# Patient Record
Sex: Male | Born: 1970
Health system: Southern US, Community
[De-identification: ages and names within clinical notes are randomized; demographics above are authoritative.]

## PROBLEM LIST (undated history)

## (undated) DIAGNOSIS — M659 Synovitis and tenosynovitis, unspecified: Secondary | ICD-10-CM

## (undated) DIAGNOSIS — S42309A Unspecified fracture of shaft of humerus, unspecified arm, initial encounter for closed fracture: Secondary | ICD-10-CM

## (undated) DIAGNOSIS — J45909 Unspecified asthma, uncomplicated: Secondary | ICD-10-CM

## (undated) DIAGNOSIS — T7840XA Allergy, unspecified, initial encounter: Secondary | ICD-10-CM

## (undated) HISTORY — DX: Unspecified asthma, uncomplicated: J45.909

## (undated) HISTORY — DX: Unspecified fracture of shaft of humerus, unspecified arm, initial encounter for closed fracture: S42.309A

## (undated) HISTORY — DX: Allergy, unspecified, initial encounter: T78.40XA

## (undated) HISTORY — PX: KNEE SURGERY: SHX244

## (undated) HISTORY — DX: Synovitis and tenosynovitis, unspecified: M65.9

---

## 2016-06-19 ENCOUNTER — Ambulatory Visit (INDEPENDENT_AMBULATORY_CARE_PROVIDER_SITE_OTHER): Payer: Managed Care, Other (non HMO) | Admitting: Family Medicine

## 2016-06-19 ENCOUNTER — Encounter: Payer: Self-pay | Admitting: Family Medicine

## 2016-06-19 VITALS — BP 121/82 | HR 72 | Temp 98.9°F | Resp 20 | Ht 71.0 in | Wt 210.2 lb

## 2016-06-19 DIAGNOSIS — M7522 Bicipital tendinitis, left shoulder: Secondary | ICD-10-CM | POA: Diagnosis not present

## 2016-06-19 DIAGNOSIS — Z7189 Other specified counseling: Secondary | ICD-10-CM

## 2016-06-19 DIAGNOSIS — Z7689 Persons encountering health services in other specified circumstances: Secondary | ICD-10-CM

## 2016-06-19 NOTE — Progress Notes (Signed)
Patient ID: Cody Bennett, male  DOB: [birth year], 45 y.o.   MRN: [id number] Patient Care Team    Relationship Specialty Notifications Start End  Natalia Leatherwood, DO PCP - General Family Medicine  06/19/16     Subjective:  Cody Bennett is a 45 y.o.  male present for new patient establishment. All past medical history, surgical history, allergies, family history, immunizations, medications and social history were ontained in the electronic medical record today. All recent labs, ED visits and hospitalizations within the last year were reviewed.   Pt was in [place] and tripped over a curb and fell to ground, around 4 months ago. Pt states since then he has had good and bad days with pain in the elbow and left shoulder. Shoulder pain is "uncomfortable" with over the head shoulder, but can be any movement. He does not notice a difference in strength or ROM. Pain is mostly located near bicep. Right medial elbow pain over distal bicep tendon.   Pt sometimes will take aleve for pain, but infrequently. He is not in pain today.  No prior neck surgery, injury or arthritis. He is working with a Chiropodist.   Health maintenance:  Colonoscopy: no fhx. Screen 50 Immunizations: tdap 2012, Influenza 2016 (encouraged yearly) Infectious disease screening: HIV 2012 PSA: No results found for: PSA   Immunization History  Administered Date(s) Administered  . Tdap 05/16/2011    Past Medical History:  Diagnosis Date  . Allergy   . Asthma    Allergies  Allergen Reactions  . Apple Swelling  . Peach [Prunus Persica] Swelling   Past Surgical History:  Procedure Laterality Date  . KNEE SURGERY Left 2010 and 2012   Family History  Problem Relation Age of Onset  . Hearing loss Mother   . Stroke Brother   . Arthritis Maternal Aunt   . Mental illness Maternal Aunt   . Arthritis Paternal Uncle   . Heart disease Paternal Uncle   . Asthma Maternal Grandmother   . Hearing loss Maternal  Grandmother   . Mental illness Maternal Grandfather    Social History   Social History  . Marital status: Married    Spouse name: N/A  . Number of children: N/A  . Years of education: N/A   Occupational History  . Not on file.   Social History Main Topics  . Smoking status: Never Smoker  . Smokeless tobacco: Never Used  . Alcohol use 3.6 oz/week    6 Glasses of wine per week  . Drug use: No  . Sexual activity: Yes    Partners: Female    Birth control/ protection: Condom     Comment: married   Other Topics Concern  . 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    Drinks caffeine.   Wears seatbelt, bicycle helmet   Exercises routinely.    Smoke detector in the home.    Feels safe in relationships.      Medication List       Accurate as of 06/19/16  1:47 PM. Always use your most recent med list.          cetirizine 10 MG tablet Commonly known as:  ZYRTEC Take 10 mg by mouth daily as needed for allergies.   loratadine 10 MG tablet Commonly known as:  CLARITIN Take 10 mg by mouth daily as needed for allergies.  No results found for this or any previous visit (from the past 2160 hour(s)).  Patient was never admitted.   ROS: 14 pt review of systems performed and negative (unless mentioned in an HPI)  Objective: BP 121/82 (BP Location: Right Arm, Patient Position: Sitting, Cuff Size: Large)   Pulse 72   Temp 98.9 F (37.2 C)   Resp 20   Ht 5\' 11"  (1.803 m)   Wt 210 lb 4 oz (95.4 kg)   SpO2 96%   BMI 29.32 kg/m  Gen: Afebrile. No acute distress. Nontoxic in appearance, well-developed, well-nourished,  Pleasant male.  HENT: AT. [place].MM[place], no oral lesions Eyes:Pupils Equal Round Reactive to light, Extraocular movements intact,  Conjunctiva without redness, discharge or icterus. Neck/lymp/endocrine: Supple,no lymphadenopathy CV: RRR, no edema, Chest: CTAB, no wheeze, rhonchi or  crackles. Abd: Soft. NTND. BS present.  Skin:  Warm and well-perfused. Skin intact. Neuro/Msk: Normal gait. PERLA. EOMi. Alert. Oriented x3.  NROM of bilateral arms, neg empty can test, left off, hawkins and O'briens. Mild discomfort with resisted flexion. Neg tinels at wrist and elbow. MS 5/5 bilateral UE. NV intact distally.  Psych: Normal affect, dress and demeanor. Normal speech. Normal thought content and judgment.  Assessment/plan: Cody Bennett is a 45 y.o. male present for est.  of care with acute complaint.  Biceps tendonitis on left - dicussed bicep tendonitis with pt. AVS on tendonitis and rehab/strengthenig.  (by HPI appears proximal and distal) - discussed arm band for lower arm with flares.  - naproxen BID PRN OTC with flares.  - pt to monitor for any signs of weakness, numbness or tingling.   - F/U PRN  - CPE schedule within 1-2 months. Can have fasting labs prior if desired CBC, CMP, TSH, lipid panel, a1c.    Return in about 1 month (around 07/19/2016) for CPE.   Greater than 30 minutes spent with patient, >50% of time spent face to face counseling patient and coordinating care.    Electronically signed by: Felix Pacinienee Kuneff, DO [facility]

## 2016-06-19 NOTE — Patient Instructions (Signed)
Biceps Tendon Tendinitis (Proximal) and Tenosynovitis With Rehab Tendonitis and tenosynovitis involve inflammation of the tendon and the tendon lining (sheath). The proximal biceps tendon is vulnerable to tendonitis and tenosynovitis, which causes pain and discomfort in the front of the shoulder and upper arm. The tendon lining secretes a fluid that helps lubricate the tendon, allowing for proper function without pain. When the tendon and its lining become inflamed, the tendon can no longer glide smoothly, causing pain. The proximal biceps tendon connects the biceps muscle to two bones of the shoulder. It is important for proper function of the elbow and turning the palm upward (supination) using the wrist. Proximal biceps tendon tendinitis may include a grade 1 or 2 strain of the tendon. Grade 1 strains involve a slight pull of the tendon without signs of tearing and no observed tendon lengthening. There is also no loss of strength. Grade 2 strains involve small tears in the tendon fibers. The tendon or muscle is stretched and strength is usually decreased.  SYMPTOMS   Pain, tenderness, swelling, warmth, or redness over the front of the shoulder.  Pain that gets worse with shoulder and elbow use, especially against resistance.  Limited motion of the shoulder or elbow.  Crackling sound (crepitation) when the tendon or shoulder is moved or touched. CAUSES  The symptoms of biceps tendonitis are due to inflammation of the tendon. Inflammation may be caused by:  Strain from sudden increase in amount or intensity of activity.  Direct blow or injury to the elbow (uncommon).  Overuse or repetitive elbow bending or wrist rotation, particularly when turning the palm up, or with elbow hyperextension. RISK INCREASES WITH:  Sports that involve contact or overhead arm activity (throwing sports, gymnastics, weightlifting, bodybuilding, rock climbing).  Heavy labor.  Poor strength and  flexibility.  Failure to warm up properly before activity. PREVENTION  Warm up and stretch properly before activity.  Allow time for recovery between activities.  Maintain physical fitness:  Strength, flexibility, and endurance.  Cardiovascular fitness.  Learn and use proper exercise technique. PROGNOSIS  With proper treatment, proximal biceps tendon tendonitis and tenosynovitis is usually curable within 6 weeks. Healing is usually quicker if the cause was a direct blow, not overuse.  RELATED COMPLICATIONS   Longer healing time if not properly treated or if not given enough time to heal.  Chronically inflamed tendon that causes persistent pain with activity, that may progress to constant pain and potentially rupture of the tendon.  Recurring symptoms, especially if activity is resumed too soon or with overuse, a direct blow, or use of poor exercise technique. TREATMENT Treatment first involves ice and medicine, to reduce pain and inflammation. It is helpful to modify activities that cause pain, to reduce the chances of causing the condition to get worse. Strengthening and stretching exercises should be performed to promote proper use of the muscles of the shoulder. These exercises may be performed at home or with a therapist. Other treatments may be given such as ultrasound or heat therapy. A corticosteroid injection may be recommended to help reduce inflammation of the tendon lining. Surgery is usually not necessary. Sometimes, if symptoms last for greater than 6 months, surgery will be advised to detach the tendon and re-insert it into the arm bone. Surgery to correct other shoulder problems that may be contributing to tendinitis may be advised before surgery for the tendinitis itself.  MEDICATION  If pain medicine is needed, nonsteroidal anti-inflammatory medicines (aspirin and ibuprofen), or other minor pain relievers (  acetaminophen), are often advised.  Do not take pain medicine  for 7 days before surgery.  Prescription pain relievers may be given if your caregiver thinks they are needed. Use only as directed and only as much as you need.  Corticosteroid injections may be given. These injections should only be used on the most severe cases, as one can only receive a limited number of them. HEAT AND COLD   Cold treatment (icing) should be applied for 10 to 15 minutes every 2 to 3 hours for inflammation and pain, and immediately after activity that aggravates your symptoms. Use ice packs or an ice massage.  Heat treatment may be used before performing stretching and strengthening activities prescribed by your caregiver, physical therapist, or athletic trainer. Use a heat pack or a warm water soak. SEEK MEDICAL CARE IF:   Symptoms get worse or do not improve in 2 weeks, despite treatment.  New, unexplained symptoms develop. (Drugs used in treatment may produce side effects.) EXERCISES RANGE OF MOTION (ROM) AND EXERCISES - Biceps Tendon (Proximal) and Tenosynovitis These exercises may help you when beginning to rehabilitate your injury. Your symptoms may go away with or without further involvement from your physician, physical therapist, or athletic trainer. While completing these exercises, remember:   Restoring tissue flexibility helps normal motion to return to the joints. This allows healthier, less painful movement and activity.  An effective stretch should be held for at least 30 seconds.  A stretch should never be painful. You should only feel a gentle lengthening or release in the stretched tissue. STRETCH - Flexion, Standing  Stand with good posture. With an underhand grip on your right / left hand and an overhand grip on the opposite hand, grasp a broomstick or cane so that your hands are a little more than shoulder width apart.  Keeping your right / left elbow straight and shoulder muscles relaxed, push the stick with your opposite hand to raise your right  / left arm in front of your body and then overhead. Raise your arm until you feel a stretch in your right / left shoulder, but before you have increased shoulder pain.  Try to avoid shrugging your right / left shoulder as your arm rises, by keeping your shoulder blade tucked down and toward your mid-back spine. Hold for __________ seconds.  Slowly return to the starting position. Repeat __________ times. Complete this exercise __________ times per day. STRETCH - Abduction, Supine  Lie on your back. With an underhand grip on your right / left hand and an overhand grip on the opposite hand, grasp a broomstick or cane so that your hands are a little more than shoulder width apart.  Keeping your right / left elbow straight and shoulder muscles relaxed, push the stick with your opposite hand to raise your right / left arm out to the side of your body and then overhead. Raise your arm until you feel a stretch in your right / left shoulder, but before you have increased shoulder pain.  Try to avoid shrugging your right / left shoulder as your arm rises, by keeping your shoulder blade tucked down and toward your mid-back spine. Hold for __________ seconds.  Slowly return to the starting position. Repeat __________ times. Complete this exercise __________ times per day. ROM - Flexion, Active-Assisted  Lie on your back. You may bend your knees for comfort.  Grasp a broomstick or cane so your hands are about shoulder width apart. Your right / left hand should   grip the end of the stick so that your hand is positioned "thumbs-up," as if you were about to shake hands.  Using your healthy arm to lead, raise your right / left arm overhead until you feel a gentle stretch in your shoulder. Hold for __________ seconds.  Use the stick to assist in returning your right / left arm to its starting position. Repeat __________ times. Complete this exercise __________ times per day.  STRETCH - Flexion, Standing    Stand facing a wall. Walk your right / left fingers up the wall until you feel a moderate stretch in your shoulder. As your hand gets higher, you may need to step closer to the wall or use a door frame to walk through.  Try to avoid shrugging your right / left shoulder as your arm rises, by keeping your shoulder blade tucked down and toward your mid-back spine.  Hold for __________ seconds. Use your other hand, if needed, to ease out of the stretch and return to the starting position. Repeat __________ times. Complete this exercise __________ times per day.  ROM - Internal Rotation   Using underhand grips, grasp a stick behind your back with both hands.  While standing upright with good posture, slide the stick up your back until you feel a mild stretch in the front of your shoulder.  Hold for __________ seconds. Slowly return to your starting position. Repeat __________ times. Complete this exercise __________ times per day.  STRETCH - Internal Rotation  Place your right / left hand behind your back, palm-up.  Throw a towel or belt over your opposite shoulder. Grasp the towel with your right / left hand.  While keeping an upright posture, gently pull up on the towel until you feel a stretch in the front of your right / left shoulder.  Avoid shrugging your right / left shoulder as your arm rises, by keeping your shoulder blade tucked down and toward your mid-back spine.  Hold for __________ seconds. Release the stretch by lowering your opposite hand. Repeat __________ times. Complete this exercise __________ times per day. STRENGTHENING EXERCISES - Biceps Tendon Tendinitis (Proximal) and Tenosynovitis These exercises may help you regain your strength after your physician has discontinued your restraint in a cast or brace. They may resolve your symptoms with or without further involvement from your physician, physical therapist or athletic trainer. While completing these exercises,  remember:   Muscles can gain both the endurance and the strength needed for everyday activities through controlled exercises.  Complete these exercises as instructed by your physician, physical therapist or athletic trainer. Increase the resistance and repetitions only as guided.  You may experience muscle soreness or fatigue, but the pain or discomfort you are trying to eliminate should never worsen during these exercises. If this pain does get worse, stop and make sure you are following the directions exactly. If the pain is still present after adjustments, discontinue the exercise until you can discuss the trouble with your caregiver. STRENGTH - Elbow Flexors, Isometric  Stand or sit upright on a firm surface. Place your right / left arm so that your hand is palm-up and at the height of your waist.  Place your opposite hand on top of your forearm. Gently push down as your right / left arm resists. Push as hard as you can with both arms, without causing any pain or movement at your right / left elbow. Hold this stationary position for __________ seconds.  Gradually release the tension in both   arms. Allow your muscles to relax completely before repeating. Repeat __________ times. Complete this exercise __________ times per day. STRENGTH - Shoulder Flexion, Isometric  With good posture and facing a wall, stand or sit about 4-6 inches away.  Keeping your right / left elbow straight, gently press the top of your fist into the wall. Increase the pressure gradually until you are pressing as hard as you can, without shrugging your shoulder or increasing any shoulder discomfort.  Hold for __________ seconds.  Release the tension slowly. Relax your shoulder muscles completely before you start the next repetition. Repeat __________ times. Complete this exercise __________ times per day.  STRENGTH - Elbow Flexors, Supinated  With good posture, stand or sit on a firm chair without armrests. Allow  your right / left arm to rest at your side with your palm facing forward.  Holding a __________ weight, or gripping a rubber exercise band or tubing,  bring your hand toward your shoulder.  Allow your muscles to control the resistance as your hand returns to your side. Repeat __________ times. Complete this exercise __________ times per day.  STRENGTH - Shoulder Flexion  Stand or sit with good posture. Grasp a __________ weight, or an exercise band or tubing, so that your hand is "thumbs-up," like when you shake hands.  Slowly lift your right / left arm as far as you can, without increasing any shoulder pain. At first, many people can only raise their hand to shoulder height.  Avoid shrugging your right / left shoulder as your arm rises, by keeping your shoulder blade tucked down and toward your mid-back spine.  Hold for __________ seconds. Control the descent of your hand as you slowly return to your starting position. Repeat __________ times. Complete this exercise __________ times per day.   This information is not intended to replace advice given to you by your health care provider. Make sure you discuss any questions you have with your health care provider.   Document Released: 10/01/2005 Document Revised: 10/22/2014 Document Reviewed: 01/13/2009 Elsevier Interactive Patient Education 2016 [employer]  Naproxen every 12 hours for a few days when pain increases.  Try arm band for lower arm/elbow.  If experience weakness, numbness or tingling would want to see you back.

## 2016-07-20 ENCOUNTER — Encounter: Payer: Self-pay | Admitting: Family Medicine

## 2016-07-20 ENCOUNTER — Ambulatory Visit (INDEPENDENT_AMBULATORY_CARE_PROVIDER_SITE_OTHER): Payer: Managed Care, Other (non HMO) | Admitting: Family Medicine

## 2016-07-20 VITALS — BP 118/83 | HR 72 | Temp 98.4°F | Resp 20 | Ht 71.0 in | Wt 209.5 lb

## 2016-07-20 DIAGNOSIS — Z1329 Encounter for screening for other suspected endocrine disorder: Secondary | ICD-10-CM

## 2016-07-20 DIAGNOSIS — Z130 Encounter for screening for diseases of the blood and blood-forming organs and certain disorders involving the immune mechanism: Secondary | ICD-10-CM

## 2016-07-20 DIAGNOSIS — Z6829 Body mass index (BMI) 29.0-29.9, adult: Secondary | ICD-10-CM | POA: Diagnosis not present

## 2016-07-20 DIAGNOSIS — Z0000 Encounter for general adult medical examination without abnormal findings: Secondary | ICD-10-CM | POA: Insufficient documentation

## 2016-07-20 DIAGNOSIS — Z23 Encounter for immunization: Secondary | ICD-10-CM | POA: Diagnosis not present

## 2016-07-20 DIAGNOSIS — Z131 Encounter for screening for diabetes mellitus: Secondary | ICD-10-CM | POA: Diagnosis not present

## 2016-07-20 DIAGNOSIS — Z13220 Encounter for screening for lipoid disorders: Secondary | ICD-10-CM | POA: Diagnosis not present

## 2016-07-20 DIAGNOSIS — M778 Other enthesopathies, not elsewhere classified: Secondary | ICD-10-CM

## 2016-07-20 LAB — COMPREHENSIVE METABOLIC PANEL
ALK PHOS: 64 U/L (ref 39–117)
ALT: 17 U/L (ref 0–53)
AST: 10 U/L (ref 0–37)
Albumin: 4.3 g/dL (ref 3.5–5.2)
BILIRUBIN TOTAL: 1.2 mg/dL (ref 0.2–1.2)
BUN: 16 mg/dL (ref 6–23)
CO2: 31 mEq/L (ref 19–32)
Calcium: 9.9 mg/dL (ref 8.4–10.5)
Chloride: 106 mEq/L (ref 96–112)
Creatinine, Ser: 1.04 mg/dL (ref 0.40–1.50)
GFR: 81.45 mL/min (ref >60.00)
GLUCOSE: 94 mg/dL (ref 70–99)
Potassium: 4.6 mEq/L (ref 3.5–5.1)
SODIUM: 142 meq/L (ref 135–145)
TOTAL PROTEIN: 6.8 g/dL (ref 6.0–8.3)

## 2016-07-20 LAB — LIPID PANEL
CHOL/HDL RATIO: 4
CHOLESTEROL: 198 mg/dL (ref 0–200)
HDL: 48.2 mg/dL (ref >39.00)
LDL CALC: 127 mg/dL — AB (ref 0–99)
NonHDL: 149.31
TRIGLYCERIDES: 114 mg/dL (ref 0.0–149.0)
VLDL: 22.8 mg/dL (ref 0.0–40.0)

## 2016-07-20 LAB — CBC WITH DIFFERENTIAL/PLATELET
BASOS ABS: 0 10*3/uL (ref 0.0–0.1)
Basophils Relative: 0.4 % (ref 0.0–3.0)
Eosinophils Absolute: 0.2 10*3/uL (ref 0.0–0.7)
Eosinophils Relative: 3.1 % (ref 0.0–5.0)
HCT: 45.7 % (ref 39.0–52.0)
Hemoglobin: 15.4 g/dL (ref 13.0–17.0)
LYMPHS ABS: 2 10*3/uL (ref 0.7–4.0)
LYMPHS PCT: 36 % (ref 12.0–46.0)
MCHC: 33.8 g/dL (ref 30.0–36.0)
MCV: 88.9 fl (ref 78.0–100.0)
MONOS PCT: 9.2 % (ref 3.0–12.0)
Monocytes Absolute: 0.5 10*3/uL (ref 0.1–1.0)
NEUTROS PCT: 51.3 % (ref 43.0–77.0)
Neutro Abs: 2.9 10*3/uL (ref 1.4–7.7)
Platelets: 246 10*3/uL (ref 150.0–400.0)
RBC: 5.13 Mil/uL (ref 4.22–5.81)
RDW: 13 % (ref 11.5–15.5)
WBC: 5.6 10*3/uL (ref 4.0–10.5)

## 2016-07-20 LAB — HEMOGLOBIN A1C: Hgb A1c MFr Bld: 5.3 % (ref 4.6–6.5)

## 2016-07-20 LAB — TSH: TSH: 1.01 u[IU]/mL (ref 0.35–4.50)

## 2016-07-20 NOTE — Progress Notes (Signed)
Patient ID: Cody Bennett, male  DOB: [birth year], 45 y.o.   MRN: [id number] Patient Care Team    Relationship Specialty Notifications Start End  Ma Hillock, DO PCP - General Family Medicine  06/19/16     Subjective:  Cody Bennett is a 45 y.o.  Male  present for CPE. All past medical history, surgical history, allergies, family history, immunizations, medications and social history were updated in the electronic medical record today. All recent labs, ED visits and hospitalizations within the last year were reviewed.  Pt has no complaints today. He is still have some mild intermittent discomfort at his elbow, discussed on prior appt. His shoulder is improved. He is using nsaids and arm band/brace which is helping. Briefly discussed capsicin cream OTC could also help during flares.   Health maintenance:  Colonoscopy: no fhx. Screen 50 Immunizations: tdap 2012, Influenza 2016 (encouraged yearly) Infectious disease screening: HIV 2012 DEXA: N/A Prostate cancer screen: low risk. Caucasian male, no changes in urinary stream. Screen around 49 Assistive device: none Oxygen use: none Patient has a Dental home. Hospitalizations/ED visits: none  Depression screen [facility] 2/9 07/20/2016 06/19/2016  Decreased Interest 0 0  Down, Depressed, Hopeless 0 0  PHQ - 2 Score 0 0   Fall Risk  07/20/2016 06/19/2016  Falls in the past year? Yes Yes  Number falls in past yr: 1 1  Injury with Fall? Yes Yes  Follow up Falls evaluation completed -   Current Exercise Habits: The patient does not participate in regular exercise at present Exercise limited by: orthopedic condition(s)  Immunization History  Administered Date(s) Administered  . Influenza,inj,Quad PF,36+ Mos 07/20/2016  . Tdap 05/16/2011     Past Medical History:  Diagnosis Date  . Allergy   . Asthma    Allergies  Allergen Reactions  . Apple Swelling  . Peach [Prunus Persica] Swelling   Past Surgical History:  Procedure  Laterality Date  . KNEE SURGERY Left 2010 and 2012   Family History  Problem Relation Age of Onset  . Hearing loss Mother   . Stroke Brother   . Arthritis Maternal Aunt   . Mental illness Maternal Aunt   . Arthritis Paternal Uncle   . Heart disease Paternal Uncle   . Asthma Maternal Grandmother   . Hearing loss Maternal Grandmother   . Mental illness Maternal Grandfather    Social History   Social History  . Marital status: Married    Spouse name: N/A  . Number of children: N/A  . Years of education: N/A   Occupational History  . Not on file.   Social History Main Topics  . Smoking status: Never Smoker  . Smokeless tobacco: Never Used  . Alcohol use 3.6 oz/week    6 Glasses of wine per week  . Drug use: No  . Sexual activity: Yes    Partners: Female    Birth control/ protection: Condom     Comment: married   Other Topics Concern  . 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    Drinks caffeine.   Wears seatbelt, bicycle helmet   Exercises routinely.    Smoke detector in the home.    Feels safe in relationships.      Medication List       Accurate as of 07/20/16  9:11 AM. Always use your most recent med list.  cetirizine 10 MG tablet Commonly known as:  ZYRTEC Take 10 mg by mouth daily as needed for allergies.        No results found for this or any previous visit (from the past 2160 hour(s)).  Patient was never admitted.   ROS: 14 pt review of systems performed and negative (unless mentioned in an HPI)  Objective: BP 118/83 (BP Location: Left Arm, Patient Position: Sitting, Cuff Size: Large)   Pulse 72   Temp 98.4 F (36.9 C)   Resp 20   Ht _0  (1.803 m)   Wt 209 lb 8 oz (95 kg)   SpO2 99%   BMI 29.22 kg/m  Gen: Afebrile. No acute distress. Nontoxic in appearance, well-developed, well-nourished,  Very pleasant, caucasian male.  HENT: AT. [place]. Bilateral TM visualized  and normal in appearance, normal external auditory canal. MMM, no oral lesions, adequate dentition. Bilateral nares within normal limits. Throat without erythema, ulcerations or exudates. no Cough on exam, no hoarseness on exam. Eyes:Pupils Equal Round Reactive to light, Extraocular movements intact,  Conjunctiva without redness, discharge or icterus. Neck/lymp/endocrine: Supple,no lymphadenopathy, no thyromegaly CV: RRR no murmur, trace edema, +2/4 P posterior tibialis pulses. no carotid bruits. No JVD. Chest: CTAB, no wheeze, rhonchi or crackles. Normal  Respiratory effort. good Air movement. Abd: Soft. falt. NTND. BS present. no Masses palpated. No hepatosplenomegaly. No rebound tenderness or guarding. Skin: no rashes, purpura or petechiae. Warm and well-perfused. Skin intact. Neuro/Msk: Normal gait. PERLA. EOMi. Alert. Oriented x3.  Cranial nerves II through XII intact. Muscle strength 5/5 upper/lower extremity. DTRs equal bilaterally. Psych: Normal affect, dress and demeanor. Normal speech. Normal thought content and judgment.   Assessment/plan: [name] is a 45 y.o. male present for CPE.  Encounter for preventive health examination Patient was encouraged to exercise greater than 150 minutes a week. Patient was encouraged to choose a diet filled with fresh fruits and vegetables, and lean meats. AVS provided to patient today for education/recommendation on gender specific health and safety maintenance. Influenza vaccine administered - Flu Vaccine QUAD 36+ mos PF IM (Fluarix & Fluzone Quad PF) BMI 29.0-29.9,adult - Comp Met (CMET) - HgB A1c - TSH - Lipid panel Screening for deficiency anemia - CBC w/Diff Thyroid disorder screen - TSH Screening cholesterol level - Lipid panel Diabetes mellitus screening - HgB A1c Tendonitis:  - Briefly discussed capsicin cream OTC could also help during flares. Continue elbow support/band, nsaids with flares. If continues to cause issues,  can send to PT/sports med.  - pt agreeable to plan and will follow up if needed.    Return for CPE. 1 year  Electronically signed by: Howard Pouch, DO [place]

## 2016-07-20 NOTE — Patient Instructions (Signed)

## 2016-07-23 ENCOUNTER — Telehealth: Payer: Self-pay | Admitting: Family Medicine

## 2016-07-23 NOTE — Telephone Encounter (Signed)
Please call pt: - his labs all look good.

## 2016-07-23 NOTE — Telephone Encounter (Signed)
Left message on patient voice mail with lab results 

## 2016-10-15 DIAGNOSIS — M65932 Unspecified synovitis and tenosynovitis, left forearm: Secondary | ICD-10-CM

## 2016-10-15 DIAGNOSIS — M659 Synovitis and tenosynovitis, unspecified: Secondary | ICD-10-CM

## 2016-10-15 HISTORY — DX: Synovitis and tenosynovitis, unspecified: M65.9

## 2016-10-15 HISTORY — DX: Unspecified synovitis and tenosynovitis, left forearm: M65.932

## 2016-10-30 ENCOUNTER — Ambulatory Visit (INDEPENDENT_AMBULATORY_CARE_PROVIDER_SITE_OTHER): Payer: Commercial Managed Care - PPO | Admitting: Family Medicine

## 2016-10-30 ENCOUNTER — Encounter: Payer: Self-pay | Admitting: Family Medicine

## 2016-10-30 ENCOUNTER — Telehealth: Payer: Self-pay | Admitting: Family Medicine

## 2016-10-30 ENCOUNTER — Ambulatory Visit (HOSPITAL_BASED_OUTPATIENT_CLINIC_OR_DEPARTMENT_OTHER)
Admission: RE | Admit: 2016-10-30 | Discharge: 2016-10-30 | Disposition: A | Discharge status: Home / Self Care | Payer: Commercial Managed Care - PPO | Source: Ambulatory Visit | Attending: Family Medicine | Admitting: Family Medicine

## 2016-10-30 VITALS — BP 123/85 | HR 81 | Temp 98.4°F | Resp 20 | Ht 71.0 in | Wt 214.5 lb

## 2016-10-30 DIAGNOSIS — R05 Cough: Secondary | ICD-10-CM | POA: Diagnosis not present

## 2016-10-30 DIAGNOSIS — R059 Cough, unspecified: Secondary | ICD-10-CM

## 2016-10-30 DIAGNOSIS — M778 Other enthesopathies, not elsewhere classified: Secondary | ICD-10-CM | POA: Diagnosis not present

## 2016-10-30 MED ORDER — MONTELUKAST SODIUM 10 MG PO TABS: 10.0000 mg | ORAL_TABLET | Freq: Every day | ORAL | 5 refills | Status: DC

## 2016-10-30 NOTE — Telephone Encounter (Signed)
Left message with xray results and instructions on patient voice mail per DPR.

## 2016-10-30 NOTE — Telephone Encounter (Signed)
Please call pt: - his cxr is normal. Treat with allergy regimen discussed.

## 2016-10-30 NOTE — Progress Notes (Signed)
Cody Bennett , 02/13/1971, 46 y.o., male MRN: [id number] Patient Care Team    Relationship Specialty Notifications Start End  Natalia Leatherwoodenee A Kuneff, DO PCP - General Family Medicine  06/19/16     CC: 2 complaints.  Subjective:   Cough: Pt states cough started in December. He reports it is a dry cough usually, but he is using mucinex DM. He endorses night sweats sometimes. He denies fever, chills, nausea, vomit, diarrhea or weight loss. He has had reflux in the past. He is taking the zyrtec intermittently. His wife noticed an increase in snoring. He endorses frontal sinus pressure and sneezing. He has just returned last night from a long trip in [place].   Left elbow pain: Pt has been using  nsaids and  arm band/brace and had done better. However, he states when he plays guitar or exercises it will flare back up. He points to the medial aspect of left elbow as location, nad denies radiation of pain, numbness or tingling.  Prior note: Pt was in [place] and tripped over a curb and fell to ground, around 4 months ago. Pt states since then he has had good and bad days with pain in the elbow and left shoulder. Shoulder pain is "uncomfortable" with over the head shoulder, but can be any movement. He does not notice a difference in strength or ROM. Pain is mostly located near bicep. Left medial elbow pain over distal bicep tendon.   Pt sometimes will take aleve for pain, but infrequently. He is not in pain today.  No prior neck surgery, injury or arthritis. He is working with a Chiropodistchiropracter.   Allergies  Allergen Reactions  . Apple Swelling  . Peach [Prunus Persica] Swelling   Social History  Substance Use Topics  . Smoking status: Never Smoker  . Smokeless tobacco: Never Used  . Alcohol use 3.6 oz/week    6 Glasses of wine per week   Past Medical History:  Diagnosis Date  . Allergy   . Asthma    Past Surgical History:  Procedure Laterality Date  . KNEE SURGERY Left 2010 and 2012   Family  History  Problem Relation Age of Onset  . Hearing loss Mother   . Stroke Brother   . Arthritis Maternal Aunt   . Mental illness Maternal Aunt   . Arthritis Paternal Uncle   . Heart disease Paternal Uncle   . Asthma Maternal Grandmother   . Hearing loss Maternal Grandmother   . Mental illness Maternal Grandfather    Allergies as of 10/30/2016      Reactions   Apple Swelling   Peach [prunus Persica] Swelling      Medication List       Accurate as of 10/30/16  8:59 AM. Always use your most recent med list.          cetirizine 10 MG tablet Commonly known as:  ZYRTEC Take 10 mg by mouth daily as needed for allergies.       No results found for this or any previous visit (from the past 24 hour(s)). No results found.   ROS: Negative, with the exception of above mentioned in HPI   Objective:  BP 123/85 (BP Location: Right Arm, Patient Position: Sitting, Cuff Size: Large)   Pulse 81   Temp 98.4 F (36.9 C)   Resp 20   Ht 5\' 11"  (1.803 m)   Wt 214 lb 8 oz (97.3 kg)   SpO2 98%   BMI 29.92 kg/m  Body mass index is 29.92 kg/m. Gen: Afebrile. No acute distress. Nontoxic in appearance, well developed, well nourished.  HENT: AT. [place]. Bilateral TM visualized WNL. MMM, no oral lesions. Bilateral nares with mild erythema, clear drainage. Throat without erythema or exudates. Cough  Present.  Eyes:Pupils Equal Round Reactive to light, Extraocular movements intact,  Conjunctiva without redness, discharge or icterus. Neck/lymp/endocrine: Supple,no lymphadenopathy CV: RRR  Chest: CTAB, no wheeze or crackles.  Abd: Soft. NTND. BS present. Neuro:  Normal gait. PERLA. EOMi. Alert. Oriented x3  MSK: no erythema or swelling left elbow. Focal ttp medial aspect just distal to olecranon process. Negative tinels. NV intact distally.  Assessment/Plan: Cody Bennett is a 46 y.o. male present for acute OV for  Tendonitis of elbow, left - worsening - Present for at least 6 months.  Failed NSAIDS, rest and arm band. Discussed SM and possible injection and pt would like to try.  - Ambulatory referral to Sports Medicine  Cough - New - Cough > 4-6 weeks.  - discussed allergies vs GERD vs infectious causes (given night sweats). - CXR ordered. - will treat as allergies, with zyrtec daily and start Singulair (prescribed) at night.  - if cxr normal, and no improvement on allergy regimen in 2-4 weeks would want to start omeprazole trial.  - F/U Dependent on clinical outcome and CXR results.   > 25 minutes spent with patient, >50% of time spent face to face    electronically signed by:  Felix Pacini, DO  [facility] - [place]

## 2016-10-30 NOTE — Patient Instructions (Addendum)
Start zyrtec daily and Singulair as been called in to your pharmacy to take nightly. Both work together to treat allergies. Continue the mucinex DM for cough for a few days until allergy regimen can take affect.   Please have chest xray.   If cough does not resolve in 4 weeks then I would want to see you and consider GERD/reflux regimen.    Please help [place] help you:  It is a privilege to be able to take care of great patients such as yourself. We are honored you have chosen [facility] for your Primary Care home. Below you will find basic instructions that you may need to access in the future. Please help [place] help you by reading the instructions, which cover many of the frequent questions we experience.   Prescription refills and request:  -In order to allow more efficient response time, please call your pharmacy for all refills. They will forward the request electronically to [place]. This allows for the quickest possible response. Request left on a nurse line can take longer to refill, since these are checked as time allows between office patients and other phone calls.  - refill request can take up to 3-5 working days to complete.  - If request is sent electronically and request is appropiate, it is usually completed in 1-2 business days.  - all patients will need to be seen routinely for all chronic medical conditions requiring prescription medications (see follow-up below). If you are overdue for follow up on your condition, you will be asked to make an appointment and we will call in enough medication to cover you until your appointment (up to 30 days).  - all controlled substances will require a face to face visit to request/refill.  - if you desire your prescriptions to go through a new pharmacy, and have an active script at original pharmacy, you will need to call your pharmacy and have scripts transferred to new pharmacy. This is completed between the pharmacy locations and not by your  provider.    Results: If any images or labs were ordered, it can take up to 1 week to get results depending on the test ordered and the lab/facility running and resulting the test. - Normal or stable results, which do not need further discussion, will be released to your mychart immediately with attached note to you. A call will not be generated for normal results. Please make certain to sign up for mychart. If you have questions on how to activate your mychart you can call the front office.  - If your results need further discussion, our office will attempt to contact you via phone, and if unable to reach you after 2 attempts, we will release your abnormal result to your mychart with instructions.  - All results will be automatically released in mychart after 1 week.  - Your provider will provide you with explanation and instruction on all relevant material in your results. Please keep in mind, results and labs may appear confusing or abnormal to the untrained eye, but it does not mean they are actually abnormal for you personally. If you have any questions about your results that are not covered, or you desire more detailed explanation than what was provided, you should make an appointment with your provider to do so.   Our office handles many outgoing and incoming calls daily. If we have not contacted you within 1 week about your results, please check your mychart to see if there is a  message first and if not, then contact our office.  In helping with this matter, you help decrease call volume, and therefore allow us to be able to respond to patients needs more efficiently.   Acute office visits (sick visit):  An acute visit is intended for a ONE new problem and are scheduled in shorter time slots to allow schedule openings for patients with new problems. This is the appropriate visit to discuss a new problem. In order to provide you with excellent quality medical care with proper time for you to  explain your problem, have an exam and receive treatment with instructions, these appointments should be limited to one new problem per visit. If you experience a new problem, in which you desire to be addressed, please make an acute office visit, we save openings on the schedule to accommodate you. Please do not save your new problem for any other type of visit, let us take care of it properly and quickly for you.   Follow up visits:  Depending on your condition(s) your provider will need to see you routinely in order to provide you with quality care and prescribe medication(s). Most chronic conditions (Example: hypertension, Diabetes, depression/anxiety... etc), require visits a couple times a year. Your provider will instruct you on proper follow up for your personal medical conditions and history. Please make certain to make follow up appointments for your condition as instructed. Failing to do so could result in lapse in your medication treatment/refills. If you request a refill, and are overdue to be seen on a condition, we will always provide you with a 30 day script (once) to allow you time to schedule.    Medicare wellness (well visit): - we have a wonderful Nurse Selena Batten(Kim), that will meet with you and provide you will yearly medicare wellness visits. These visits should occur yearly (can not be scheduled less than 1 calendar year apart) and cover preventive health, immunizations, advance directives and screenings you are entitled to yearly through your medicare benefits. Do not miss out on your entitled benefits, this is when medicare will pay for these benefits to be ordered for you.  These are strongly encouraged by your provider and is the appropriate type of visit to make certain you are up to date with all preventive health benefits. If you have not had your medicare wellness exam in the last 12 months, please make certain to schedule one by calling the office and schedule your medicare wellness  with Selena BattenKim as soon as possible.   Yearly physical (well visit):  - Adults are recommended to be seen yearly for physicals. Check with your insurance and date of your last physical, most insurances require one calendar year between physicals. Physicals include all preventive health topics, screenings, medical exam and labs that are appropriate for gender/age and history. You may have fasting labs needed at this visit. This is a well visit (not a sick visit), acute topics should not be covered during this visit.  - Pediatric patients are seen more frequently when they are younger. Your provider will advise you on well child visit timing that is appropriate for your their age. - This is not a medicare wellness visit. Medicare wellness exams do not have an exam portion to the visit. Some medicare companies allow for a physical, some do not allow a yearly physical. If your medicare allows a yearly physical you can schedule the medicare wellness with our nurse Selena BattenKim and have your physical with your provider after, on  the same day. Please check with insurance for your full benefits.   Late Policy/No Shows:  - all new patients should arrive 15-30 minutes earlier than appointment to allow [place] time  to  obtain all personal demographics,  insurance information and for you to complete office paperwork. - All established patients should arrive 10-15 minutes earlier than appointment time to update all information and be checked in .  - In our best efforts to run on time, if you are late for your appointment you will be asked to either reschedule or if able, we will work you back into the schedule. There will be a wait time to work you back in the schedule,  depending on availability.  - If you are unable to make it to your appointment as scheduled, please call 24 hours ahead of time to allow [place] to fill the time slot with someone else who needs to be seen. If you do not cancel your appointment ahead of time, you may be  charged a no show fee.

## 2016-10-31 ENCOUNTER — Ambulatory Visit: Payer: Self-pay | Admitting: Family Medicine

## 2016-11-20 ENCOUNTER — Encounter: Payer: Self-pay | Admitting: Family Medicine

## 2016-11-20 ENCOUNTER — Ambulatory Visit (INDEPENDENT_AMBULATORY_CARE_PROVIDER_SITE_OTHER): Payer: Commercial Managed Care - PPO | Admitting: Family Medicine

## 2016-11-20 VITALS — BP 137/75 | HR 81 | Wt 218.0 lb

## 2016-11-20 DIAGNOSIS — M778 Other enthesopathies, not elsewhere classified: Secondary | ICD-10-CM | POA: Diagnosis not present

## 2016-11-20 MED ORDER — NITROGLYCERIN 0.2 MG/HR TD PT24: MEDICATED_PATCH | TRANSDERMAL | 1 refills | Status: DC

## 2016-11-20 NOTE — Progress Notes (Signed)
   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 He has noticed left medial elbow pain now for months. He noted the pain occurring in the spring of 2017 after [place]. Since then he's had intermittent waxing and waning medial elbow pain with exercise as well as normal life activities. For example he notes pain when he has to carry a suitcase for travel. Additionally the pain is bothersome when he plays the guitar. He uses his left hand to pick. The pain is limiting his activity some. He's been seen by his primary care provider twice for this issue and failed to improve with a trial of oral NSAIDs which she has trouble tolerating as well as with a trial of home exercise program. He denies any radiating pain weakness or numbness. He denies any significant injury to this elbow to explain the onset of pain.  Past medical history, Surgical history, Family history not pertinant except as noted below, Social history, Allergies, and medications have been entered into the medical record, reviewed, and no changes needed.   Review of Systems: No headache, visual changes, nausea, vomiting, diarrhea, constipation, dizziness, abdominal pain, skin rash, fevers, chills, night sweats, weight loss, swollen lymph nodes, body aches, joint swelling, muscle aches, chest pain, shortness of breath, mood changes, visual or auditory hallucinations.   Objective:    Vitals:   11/20/16 0832  BP: 137/75  Pulse: 81   General: Well Developed, well nourished, and in no acute distress.  Neuro/Psych: Alert and oriented x3, extra-ocular muscles intact, able to move all 4 extremities, sensation grossly intact. Skin: Warm and dry, no rashes noted.  Respiratory: Not using accessory muscles, speaking in full sentences, trachea midline.  Cardiovascular: Pulses palpable, no  extremity edema. Abdomen: Does not appear distended. MSK: Left elbow and wrist are normal-appearing. The left elbow is tender to palpation at the medial epicondyle. Elbow motion is intact. Elbow stability is intact. Elbow and wrist strength are intact over patient does experience pain with resisted wrist flexion  Left medial elbow ultrasound. Normal bony structures. The insertion of the common flexor tendon insertion onto the medial epicondyle has areas of hyperechoic change consistent with chronic tendinopathy. There are no visible large tears nor is there any significant increased neovascularity. Impression: Medial epicondylitis.  No results found for this or any previous visit (from the past 24 hour(s)). No results found.  Impression and Recommendations:    Assessment and Plan: 46 y.o. male with Medial epicondylitis of the left elbow. Plan to continue trial of conservative management. Plan refer to formal hand/physical therapy as well as treat with home exercise program. We'll use nitroglycerin patch protocol and recheck in 4-6 weeks.   Discussed warning signs or symptoms. Please see discharge instructions. Patient expresses understanding.  CC: Felix Pacinienee Kuneff, DO

## 2016-11-20 NOTE — Patient Instructions (Signed)
Thank you for coming in today. Do the home exercises frequently.  Recheck in 4-6 weeks or sooner if needed.   Nitroglycerin Protocol   Apply 1/4 nitroglycerin patch to affected area daily.  Change position of patch within the affected area every 24 hours.  You may experience a headache during the first 1-2 weeks of using the patch, these should subside.  If you experience headaches after beginning nitroglycerin patch treatment, you may take your preferred over the counter pain reliever.  Another side effect of the nitroglycerin patch is skin irritation or rash related to patch adhesive.  Please notify our office if you develop more severe headaches or rash, and stop the patch.  Tendon healing with nitroglycerin patch may require 12 to 24 weeks depending on the extent of injury.  Men should not use if taking Viagra, Cialis, or Levitra.   Do not use if you have migraines or rosacea.    Golfer's Elbow Golfer's elbow, also called medial epicondylitis, is a condition that results from inflammation of the strong bands of tissue (tendons) that attach your forearm muscles to the inside of your bone at the elbow. These tendons affect the muscles that bend the palm toward the wrist (flexion). This condition is called golfer's elbow because it is more common among people who constantly bend and twist their wrists, such as golfers. This injury usually results from overuse. Tendons also become less flexible with age. This condition causes elbow pain that may spread to your forearm and upper arm. The pain may get worse when you bend your wrist downward. What are the causes? This condition is an overuse injury that is caused by:  Repeatedly flexing, turning, or twisting your wrist.  Constantly gripping objects with your hands. What increases the risk? This condition is more likely to develop in people who play golf or tennis or have jobs that require the constant use of their hands. This injury  is more common among:  Carpenters.  Gardeners.  Musicians.  Bricklayers.  Typists. What are the signs or symptoms? Symptoms of this condition include:  Pain near the inner elbow or forearm.  Reduced grip strength. How is this diagnosed? This condition is diagnosed based on your symptoms, medical history, and physical exam. During the exam, your health care provider may test your grip strength and move your wrist to check for pain. You may also have an MRI to confirm the diagnosis, look for other issues, and check for tears in the ligaments, muscles, or tendons. How is this treated? Treatment for this condition includes:  Stopping all activities that make you bend or twist your wrist until your pain and other symptoms go away.  Icing your wrist to relieve pain.  Taking NSAIDs or getting corticosteroid injections to reduce pain and swelling.  Doing stretches, range-of-motion, and strengthening exercises (physical therapy) as told by your health care provider. In rare cases, surgery may be needed if your condition does not improve. Follow these instructions at home:  If directed, apply ice to the injured area.  Put ice in a plastic bag.  Place a towel between your skin and the bag.  Leave the ice on for 20 minutes, 2-3 times a day.  Move your fingers often to avoid stiffness.  Raise (elevate) the injured area above the level of your heart while you are sitting or lying down.  Return to your normal activities as told by your health care provider. Ask your health care provider what activities are safe for  you.  Do exercises as told by your health care provider.  Do not use tobacco products, including cigarettes, chewing tobacco, or e-cigarettes. If you need help quitting, ask your health care provider.  Take over-the-counter and prescription medicines only as told by your health care provider.  Keep all follow-up visits as told by your health care provider. This is  important. How is this prevented?  Warm up and stretch before being active.  Cool down and stretch after being active.  Give your body time to rest between periods of activity.  Make sure to use equipment that fits you.  Be safe and responsible while being active to avoid falls.  Do at least 150 minutes of moderate-intensity exercise each week, such as brisk walking or water aerobics.  Maintain physical fitness, including:  Strength.  Flexibility.  Cardiovascular fitness.  Endurance.  Perform exercises to strengthen the forearm muscles.  Slow your golf swing to reduce shock in the arm when making contact with the ball, if you play golf. Contact a health care provider if:  Your pain does not improve or it gets worse.  You notice numbness in your hand. Get help right away if:  Your pain is severe.  You cannot move your wrist. This information is not intended to replace advice given to you by your health care provider. Make sure you discuss any questions you have with your health care provider. Document Released: 10/01/2005 Document Revised: 06/05/2016 Document Reviewed: 06/13/2015 Elsevier Interactive Patient Education  2017 [employer].

## 2016-11-23 ENCOUNTER — Ambulatory Visit: Payer: Commercial Managed Care - PPO | Admitting: Physical Therapy

## 2016-11-23 ENCOUNTER — Ambulatory Visit: Payer: Commercial Managed Care - PPO | Attending: Family Medicine | Admitting: Physical Therapy

## 2016-11-23 DIAGNOSIS — M25522 Pain in left elbow: Secondary | ICD-10-CM | POA: Insufficient documentation

## 2016-11-23 NOTE — Therapy (Signed)
[facility] Outpatient Rehabilitation [facility] [address]  [address] [place], [place], [address] Phone: [phone]   Fax:  [phone]  Physical Therapy Evaluation  Patient Details  Name: Cody Bennett MRN: [id number] Date of Birth: December 11, 1970 Referring Provider: Dr. Rodolph Bong, MD  Encounter Date: 11/23/2016      PT End of Session - 11/23/16 1030    Visit Number 1   Number of Visits 12   Date for PT Re-Evaluation 01/04/17   PT Start Time 0807   PT Stop Time 0843   PT Time Calculation (min) 36 min   Activity Tolerance Patient tolerated treatment well   Behavior During Therapy [facility] for tasks assessed/performed      Past Medical History:  Diagnosis Date  . Allergy   . Asthma     Past Surgical History:  Procedure Laterality Date  . KNEE SURGERY Left 2010 and 2012    There were no vitals filed for this visit.       Subjective Assessment - 11/23/16 0810    Subjective Pt reports that last spring, he started to develop L medial elbow pain. He had just bought a kayak and was trying out several different paddles that all had different angles on them. He believes this was about the same time that the medial elbow started. He does report that he fell on his L shoulder about a month before his elbow began hurting. He states it was sore for a little bit but went away on its own. He reports the pain has gone away and come back several times. The pain is mild but it is present and gets in the way of him doing the things he enjoys. He reports no N/T in the arm.    Diagnostic tests [place]: 11/20/16 "Normal bony structures. The insertion of the common flexor tendon insertion onto the medial epicondyle has areas of hyperechoic change consistent with chronic tendinopathy. There are no visible large tears nor is there any significant increased neovascularity."   Patient Stated Goals "back to zero pain all of the time"   Currently in Pain? Yes   Pain Score 3   Worst:  7-8/10 Best: 0/10 Avg: 2/10   Pain Location Elbow   Pain Orientation Medial;Left   Pain Descriptors / Indicators Aching;Pins and needles   Pain Type Chronic pain   Pain Radiating Towards starts near medial joint line and goes about halfway down   Pain Onset More than a month ago   Pain Frequency Intermittent   Aggravating Factors  Carrying luggage through airport; clearing snow from driveway; vibration coming through the arm   Pain Relieving Factors massaging; advil; ice   Effect of Pain on Daily Activities doesn't effect much but can no longer do some sports or activities that he enjoys due to the pain.             [facility] PT Assessment - 11/23/16 0817      Assessment   Medical Diagnosis Tendonitis of Elbow, Left   Referring Provider Dr. Rodolph Bong, MD   Onset Date/Surgical Date --  Last Spring- April/May   Hand Dominance Left   Next MD Visit TBD: 4-6 weeks   Prior Therapy None     Prior Function   Level of Independence Independent   Vocation Full time employment   [employer], Writing, traveling   Leisure [place], playing Guitar, reading, traveling     Observation/Other Assessments   Focus on Therapeutic Outcomes (  FOTO)  Elbow: 67% (33% limitation) Predicted 74% (26% limitation)     Posture/Postural Control   Posture/Postural Control No significant limitations     ROM / Strength   AROM / PROM / Strength AROM;Strength     AROM   Overall AROM Comments Elbow/Wrist AROM WFL and no pain   AROM Assessment Site Elbow;Wrist   Right/Left Elbow Right;Left   Right/Left Wrist Left     Strength   Overall Strength Comments pronation/supination 5/5; Grip Strength L 55# R 79# Some pain with grip on the L side   Strength Assessment Site Elbow;Wrist;Shoulder   Right/Left Elbow Right;Left   Right Elbow Flexion 5/5   Right Elbow Extension 5/5   Left Elbow Flexion 5/5  Painful   Left Elbow Extension 5/5   Right/Left Wrist Right;Left   Right Wrist Flexion 5/5    Right Wrist Extension 5/5   Left Wrist Flexion 4+/5  painful   Left Wrist Extension 5/5     Palpation   Palpation comment tender immediately below medial joint line/medial epicondyle and slightly anterior; some stretching felt when doing wrist flexor stretch                           PT Education - 11/23/16 1030    Education provided Yes   Education Details Eval findings, POC, & initial HEP   Person(s) Educated Patient   Methods Explanation;Demonstration;Handout   Comprehension Verbalized understanding;Returned demonstration          PT Short Term Goals - 11/23/16 1043      PT SHORT TERM GOAL #1   Title Pt will be independent with initial HEP by 12/07/16.   Status New           PT Long Term Goals - 11/23/16 1044      PT LONG TERM GOAL #1   Title pt will be independent with advanced HEP by 01/04/17   Status New     PT LONG TERM GOAL #2   Title Pt will report at least a 50% reduction in pain during normal daily activities and job tasks by 01/04/17.   Status New     PT LONG TERM GOAL #3   Title Pt will have improved L grip strength to within 5# of R grip strength to allow for improved function of the L UE by 01/04/17.   Status New     PT LONG TERM GOAL #4   Title Pt will report he is able to play his guitar for at least 30 minutes without an increase in L medial elbow pain by 01/04/17.   Status New               Plan - 11/23/16 1031    Clinical Impression Statement Cody Bennett is a 46 year old male who reports to therapy with complaints of L medial elbow pain that began around last April. He reports that it has gone away and come back several times. Pt demonstrates good sitting posture. He has B wrist, elbow, and shoulder AROM WFL. His strength is 5/5 bilaterally at the elbow and wrist except for L wrist flexion which is 4+/5 & painful. He also had pain during L elbow flexion strength testing. His grip strength was decreased on the L side at  55# with some pain noted. His R grip strength was 79#. He is left hand dominant. Upon palpation, patient had tenderness near the L medial joint line & medial  epicondyle and just slightly anterior to the medial joint line. Muscle tension was also noted in the L flexor bundle. He reported a stretch feeling with passive wrist extension indicating tight L wrist flexors. He will benefit from skilled physical therapy to decrease L elbow pain, improve L wrist flexor flexibility, and improve function of the L UE.    Rehab Potential Good   PT Frequency 2x / week   PT Duration 6 weeks   PT Treatment/Interventions Patient/family education;ADLs/Self Care Home Management;Cryotherapy;Electrical Stimulation;Moist Heat;Ultrasound;Therapeutic exercise;Manual techniques;Dry needling;Taping;Iontophoresis 4mg /ml Dexamethasone   PT Next Visit Plan begin grip and wrist & elbow flexor strengthening with a focus on eccentric strengthening; STM to flexor bundle as indicated; manual therapy & modalities PRN   Consulted and Agree with Plan of Care Patient      Patient will benefit from skilled therapeutic intervention in order to improve the following deficits and impairments:  Decreased activity tolerance, Decreased strength, Impaired flexibility, Impaired UE functional use, Pain  Visit Diagnosis: Pain in left elbow      G-Codes - 11/23/16 1048    Functional Assessment Tool Used Elbow: 67% (33% limitation) Predicted 74% (26% limitation)        Problem List Patient Active Problem List   Diagnosis Date Noted  . BMI 29.0-29.9,adult 07/20/2016  . Encounter for preventive health examination 07/20/2016  . Tendonitis of elbow, left 07/20/2016    Katheran Jamesaylor Heather Streeper, SPT 11/23/2016, 10:54 AM  [facility] [address]  [address] [place], [place], [address] Phone: [phone]   Fax:  [phone]  Name: Cody Bennett MRN: [id number] Date of Birth: 04/20/1971

## 2016-11-26 ENCOUNTER — Ambulatory Visit: Payer: Commercial Managed Care - PPO

## 2016-11-26 DIAGNOSIS — M25522 Pain in left elbow: Secondary | ICD-10-CM

## 2016-11-26 NOTE — Therapy (Signed)
[facility] Outpatient Rehabilitation [facility] [address]  [address] [place], [place], [address] Phone: [phone]   Fax:  [phone]  Physical Therapy Treatment  Patient Details  Name: Cody Bennett MRN: [id number] Date of Birth: 1971/08/12 Referring Provider: Dr. Rodolph Bong, MD  Encounter Date: 11/26/2016      PT End of Session - 11/26/16 0807    Visit Number 2   Number of Visits 12   Date for PT Re-Evaluation 01/04/17   PT Start Time 0803  pt. arrived late    PT Stop Time 0854  ice pack to end tx   PT Time Calculation (min) 51 min   Activity Tolerance Patient tolerated treatment well   Behavior During Therapy [facility] for tasks assessed/performed      Past Medical History:  Diagnosis Date  . Allergy   . Asthma     Past Surgical History:  Procedure Laterality Date  . KNEE SURGERY Left 2010 and 2012    There were no vitals filed for this visit.      Subjective Assessment - 11/26/16 0806    Subjective Pt. reporting he tried chair pushups this weekend and was able to perform without pain.     Patient Stated Goals "back to zero pain all of the time"   Currently in Pain? No/denies   Pain Score 0-No pain   Multiple Pain Sites No           [facility] Adult PT Treatment/Exercise - 11/26/16 0814      Elbow Exercises   Forearm Supination AROM;Seated;Left;10 reps  2 sets    Forearm Supination Limitations with golf club     Wrist Exercises   Wrist Radial Deviation AROM;20 reps;Seated;Left  2 sets    Bar Weights/Barbell (Radial Deviation) 3 lbs   Wrist Ulnar Deviation AROM;20 reps;Seated;Left  2 sets    Bar Weights/Barbell (Ulnar Deviation) 4 lbs   Other wrist exercises Seated L wrist flexion 3# 3" 3 x 20 reps   Other wrist exercises Seated L wrist flexor stretch 5 x 30"      Modalities   Modalities Cryotherapy     Cryotherapy   Number Minutes Cryotherapy 10 Minutes   Cryotherapy Location --  L elbow    Type of Cryotherapy Ice pack      Manual Therapy   Manual Therapy Soft tissue mobilization   Soft tissue mobilization cross-friction massage across proximal flexor tendon (medial elbow) x 3 min                 PT Education - 11/26/16 1304    Education Details Wrist flexion curls (with 2# dumbbell pt. stating has at home)    Person(s) Educated Patient   Methods Explanation;Demonstration;Verbal cues;Handout   Comprehension Verbalized understanding;Returned demonstration;Verbal cues required;Need further instruction          PT Short Term Goals - 11/26/16 6962      PT SHORT TERM GOAL #1   Title Pt will be independent with initial HEP by 12/07/16.   Status On-going           PT Long Term Goals - 11/26/16 9528      PT LONG TERM GOAL #1   Title pt will be independent with advanced HEP by 01/04/17   Status On-going     PT LONG TERM GOAL #2   Title Pt will report at least a 50% reduction in pain during normal daily activities and job tasks by 01/04/17.  Status On-going     PT LONG TERM GOAL #3   Title Pt will have improved L grip strength to within 5# of R grip strength to allow for improved function of the L UE by 01/04/17.   Status On-going     PT LONG TERM GOAL #4   Title Pt will report he is able to play his guitar for at least 30 minutes without an increase in L medial elbow pain by 01/04/17.   Status On-going               Plan - 11/26/16 16100808    Clinical Impression Statement Pt. tolerated initiation of wrist/elbow flexor strengthening well today with extension stretch performed throughout treatment today.  Manual cross-friction massage to proximal medial elbow performed today with pt. tolerating well.  Pt. reporting good relief from ice at home thus ice applied to medial elbow to end treatment today.  Will plan to progress pt. per tolerance in coming visits.   PT Treatment/Interventions Patient/family education;ADLs/Self Care Home Management;Cryotherapy;Electrical Stimulation;Moist  Heat;Ultrasound;Therapeutic exercise;Manual techniques;Dry needling;Taping;Iontophoresis 4mg /ml Dexamethasone   PT Next Visit Plan Continue grip and wrist & elbow flexor strengthening with a focus on eccentric strengthening; STM to flexor bundle as indicated; manual therapy & modalities PRN      Patient will benefit from skilled therapeutic intervention in order to improve the following deficits and impairments:  Decreased activity tolerance, Decreased strength, Impaired flexibility, Impaired UE functional use, Pain  Visit Diagnosis: Pain in left elbow     Problem List Patient Active Problem List   Diagnosis Date Noted  . BMI 29.0-29.9,adult 07/20/2016  . Encounter for preventive health examination 07/20/2016  . Tendonitis of elbow, left 07/20/2016    Kermit BaloMicah Kylar Leonhardt, PTA 11/26/16 1:09 PM  [facility] [address]  [address] [place], [place], [address] Phone: [phone]   Fax:  [phone]  Name: Cody Bennett MRN: [id number] Date of Birth: 10/28/1970

## 2016-11-27 ENCOUNTER — Ambulatory Visit: Payer: Commercial Managed Care - PPO | Admitting: Physical Therapy

## 2016-11-30 ENCOUNTER — Ambulatory Visit: Payer: Commercial Managed Care - PPO | Admitting: Physical Therapy

## 2016-11-30 DIAGNOSIS — M25522 Pain in left elbow: Secondary | ICD-10-CM | POA: Diagnosis not present

## 2016-11-30 NOTE — Therapy (Signed)
[facility] [address]  [address] [place], [place], [address] Phone: [phone]   Fax:  [phone]  Physical Therapy Treatment  Patient Details  Name: Cody Bennett MRN: [id number] Date of Birth: 08/24/1971 Referring Provider: Dr. Rodolph BongEvan S. Corey, MD  Encounter Date: 11/30/2016      PT End of Session - 11/30/16 0954    Visit Number 3   Number of Visits 12   Date for PT Re-Evaluation 01/04/17   PT Start Time 0802   PT Stop Time 0845   PT Time Calculation (min) 43 min   Activity Tolerance Patient tolerated treatment well   Behavior [facility] for tasks assessed/performed      Past Medical History:  Diagnosis Date  . Allergy   . Asthma     Past Surgical History:  Procedure Laterality Date  . KNEE SURGERY Left 2010 and 2012    There were no vitals filed for this visit.      Subjective Assessment - 11/30/16 0804    Subjective Pt reports his elbow pain has been on & off. Pt was able to perform HEP at home.    Patient Stated Goals "back to zero pain all of the time"   Currently in Pain? Yes   Pain Score 3    Pain Location Elbow   Pain Orientation Left;Medial   Pain Descriptors / Indicators Aching  "strain"   Pain Type Chronic pain   Pain Radiating Towards stays near elbow joint   Pain Onset More than a month ago   Pain Frequency Intermittent   Aggravating Factors  Carrying luggage through airport; claring snow from driveway; vibration coming through the arm   Pain Relieving Factors massaging; ice; advil   Effect of Pain on Daily Activities doesn't effect much but can no longer do some sports or activities that he enjoys due to the pain.                          [facility] Adult PT Treatment/Exercise - 11/30/16 0001      Shoulder Exercises: Seated   Retraction Both;5 reps   Retraction Limitations 5" holds     Shoulder Exercises: ROM/Strengthening   UBE (Upper Arm Bike) Lvl 2.0 x 6'  (3 fwd/3 back)   Pushups 15 reps   Pushups Limitations Wall push up +; 5" holds     Wrist Exercises   Forearm Supination Left;10 reps;Seated   Forearm Supination Limitations 2 sets with golf club; holding near grip   Forearm Pronation Left;10 reps;Seated   Forearm Pronation Limitations  2 sets with golf club; holding near grip   Wrist Flexion Left;20 reps;Seated   Bar Weights/Barbell (Wrist Flexion) 4 lbs   Wrist Flexion Limitations 2 sets   Wrist Extension Left;20 reps;Seated   Bar Weights/Barbell (Wrist Extension) 4 lbs   Wrist Radial Deviation AROM;20 reps;Seated;Left   Bar Weights/Barbell (Radial Deviation) 4 lbs   Wrist Ulnar Deviation Left;20 reps;Seated;Theraband   Theraband Level (Ulnar Deviation) Level 3 (Green)   Other wrist exercises --   Other wrist exercises Seated L wrist flexor stretch 2 x 30"      Cryotherapy   Number Minutes Cryotherapy 5 Minutes   Cryotherapy Location --  L medial elbow   Type of Cryotherapy Ice massage     Manual Therapy   Soft tissue mobilization cross-friction massage across proximal flexor tendon (medial elbow)  PT Short Term Goals - 11/26/16 0807      PT SHORT TERM GOAL #1   Title Pt will be independent with initial HEP by 12/07/16.   Status On-going           PT Long Term Goals - 11/26/16 1610      PT LONG TERM GOAL #1   Title pt will be independent with advanced HEP by 01/04/17   Status On-going     PT LONG TERM GOAL #2   Title Pt will report at least a 50% reduction in pain during normal daily activities and job tasks by 01/04/17.   Status On-going     PT LONG TERM GOAL #3   Title Pt will have improved L grip strength to within 5# of R grip strength to allow for improved function of the L UE by 01/04/17.   Status On-going     PT LONG TERM GOAL #4   Title Pt will report he is able to play his guitar for at least 30 minutes without an increase in L medial elbow pain by 01/04/17.   Status On-going                Plan - 11/30/16 9604    Clinical Impression Statement Pt is reporting that his pain is becoming more localized to right around the medial epicondyle. He reports he was just a little sore following last session. Today's session was focused on progressing wrist strengthening & introducing scapular stabilization exercises. Pt was able to tolerate today's session well with no increase in pain but with reported slight muscle fatigue following exercises. Pt was given STM & ice massage slightly distal to medial epicondyle. Pt will continue to benefit from skilled therapy to reduce elbow pain, improve wrist flexor strength, & improve function of L UE.    Rehab Potential Good   PT Treatment/Interventions Patient/family education;ADLs/Self Care Home Management;Cryotherapy;Electrical Stimulation;Moist Heat;Ultrasound;Therapeutic exercise;Manual techniques;Dry needling;Taping;Iontophoresis 4mg /ml Dexamethasone   PT Next Visit Plan Continue grip and wrist & elbow flexor strengthening with a focus on eccentric strengthening; STM to flexor bundle as indicated; manual therapy & modalities PRN   Consulted and Agree with Plan of Care Patient      Patient will benefit from skilled therapeutic intervention in order to improve the following deficits and impairments:  Decreased activity tolerance, Decreased strength, Impaired flexibility, Impaired UE functional use, Pain  Visit Diagnosis: Pain in left elbow     Problem List Patient Active Problem List   Diagnosis Date Noted  . BMI 29.0-29.9,adult 07/20/2016  . Encounter for preventive health examination 07/20/2016  . Tendonitis of elbow, left 07/20/2016    Katheran James, SPT 11/30/2016, 10:03 AM  [facility] [address]  [address] [place], [place], [address] Phone: [phone]   Fax:  [phone]  Name: Cody Bennett MRN: [id number] Date of Birth: 1971/07/11

## 2016-12-04 ENCOUNTER — Ambulatory Visit: Payer: Commercial Managed Care - PPO | Admitting: Physical Therapy

## 2016-12-04 DIAGNOSIS — M25522 Pain in left elbow: Secondary | ICD-10-CM | POA: Diagnosis not present

## 2016-12-04 NOTE — Therapy (Signed)
[facility] [place] [address]  [address] [place], [place], [address] Phone: [phone]   Fax:  [phone]  Physical Therapy Treatment  Patient Details  Name: Cody Bennett MRN: [id number] Date of Birth: 01/24/1971 Referring Provider: Dr. Rodolph BongEvan S. Corey, MD  Encounter Date: 12/04/2016      PT End of Session - 12/04/16 0957    Visit Number 4   Number of Visits 12   Date for PT Re-Evaluation 01/04/17   PT Start Time 0847   PT Stop Time 0930   PT Time Calculation (min) 43 min   Activity Tolerance Patient tolerated treatment well   Behavior During Therapy [facility] for tasks assessed/performed      Past Medical History:  Diagnosis Date  . Allergy   . Asthma     Past Surgical History:  Procedure Laterality Date  . KNEE SURGERY Left 2010 and 2012    There were no vitals filed for this visit.      Subjective Assessment - 12/04/16 0851    Subjective pt reports elbow has been pretty good but there was one moment this weekend where he had some pain. He worked out this weekend and had no problems during the workout but was a little sore following the workout. Pt is noticing pain stays right near the medial joint line on the L side.    Patient Stated Goals "back to zero pain all of the time"   Currently in Pain? Yes   Pain Score 2    Pain Location Elbow   Pain Orientation Left;Medial   Pain Descriptors / Indicators Aching   Pain Type Chronic pain   Pain Onset More than a month ago   Pain Frequency Intermittent   Aggravating Factors  Carrying luggage through airport, vibration coming through the arm; playing guitar > 30 minutes    Pain Relieving Factors massaging; ice; advil   Effect of Pain on Daily Activities doesn't effect much but can no longer do some sports or activities that he enjoys due to the pain.                         [facility] Adult PT Treatment/Exercise - 12/04/16 0001      Elbow Exercises   Forearm Supination Left;10 reps;Seated   Forearm Supination Limitations golf club with 1# wt on end; holding near grip   Forearm Pronation Left;10 reps;Seated   Forearm Pronation Limitations golf club with 1# wt on end; hold near grip   Wrist Flexion Left;20 reps;Seated   Bar Weights/Barbell (Wrist Flexion) 5 lbs   Wrist Flexion Limitations 2 sets   Wrist Extension Left;20 reps;Seated   Bar Weights/Barbell (Wrist Extension) 5 lbs     Shoulder Exercises: Prone   Other Prone Exercises L arm; Prone on table I, T, Y; 15 reps; 3" holds; 4# wt     Shoulder Exercises: ROM/Strengthening   UBE (Upper Arm Bike) lvl 2.5 x 6' (3' fwd/3' back)     Wrist Exercises   Wrist Radial Deviation AROM;20 reps;Seated;Left   Bar Weights/Barbell (Radial Deviation) 5 lbs   Wrist Ulnar Deviation Left;20 reps;Seated;Theraband   Theraband Level (Ulnar Deviation) Level 3 (Green)   Other wrist exercises Seated L wrist extension stretch; 2 reps x 30"    Other wrist exercises Seated L wrist flexor stretch 2 x 30"      Cryotherapy   Number Minutes Cryotherapy 5 Minutes   Cryotherapy Location --  L Medial Elbow   Type of Cryotherapy Ice massage     Manual Therapy   Soft tissue mobilization cross-friction massage across proximal flexor tendon (medial elbow)                 PT Education - 12/04/16 0955    Education provided Yes   Education Details updated HEP   Person(s) Educated Patient   Methods Explanation;Demonstration;Handout   Comprehension Verbalized understanding;Returned demonstration          PT Short Term Goals - 11/26/16 0807      PT SHORT TERM GOAL #1   Title Pt will be independent with initial HEP by 12/07/16.   Status On-going           PT Long Term Goals - 12/04/16 1002      PT LONG TERM GOAL #1   Title pt will be independent with advanced HEP by 01/04/17   Status On-going     PT LONG TERM GOAL #2   Title Pt will report at least a 50% reduction in pain during normal  daily activities and job tasks by 01/04/17.   Status On-going     PT LONG TERM GOAL #3   Title Pt will have improved L grip strength to within 5# of R grip strength to allow for improved function of the L UE by 01/04/17.   Status On-going     PT LONG TERM GOAL #4   Title Pt will report he is able to play his guitar for at least 1 hour without an increase in L medial elbow pain by 01/04/17.   Status Revised  Achieved 30 minutes on 12-04-16               Plan - 12/04/16 1000    Clinical Impression Statement Pt is reporting he is noticing the benefits from therapy. He began working out again this weekend and had no problems during the workout with some mild soreness following the workout. Today's session continued to focus on progressing wrist strength & scapular strengthening. Prone I, T, & Y's were introduced on the table today with the patient reporting muscle fatigue following the exercise. He was able to perform all exercises correctly and had no increase in pain. STM was performed to wrist flexors near L medial epicondyle with pt reporting his pain is becoming more localized to the area. Ice massage was continued to help with soreness while pt is at work today. He will continue to benefit from L UE strengthening & scapular stabilization exercises to improve overall function of L UE.    Rehab Potential Good   PT Treatment/Interventions Patient/family education;ADLs/Self Care Home Management;Cryotherapy;Electrical Stimulation;Moist Heat;Ultrasound;Therapeutic exercise;Manual techniques;Dry needling;Taping;Iontophoresis 4mg /ml Dexamethasone   PT Next Visit Plan Assess STG; Continue grip and wrist & elbow flexor strengthening with a focus on eccentric strengthening & scapular strengethening; STM to flexor bundle as indicated; manual therapy & modalities PRN   Consulted and Agree with Plan of Care Patient      Patient will benefit from skilled therapeutic intervention in order to improve the  following deficits and impairments:  Decreased activity tolerance, Decreased strength, Impaired flexibility, Impaired UE functional use, Pain  Visit Diagnosis: Pain in left elbow     Problem List Patient Active Problem List   Diagnosis Date Noted  . BMI 29.0-29.9,adult 07/20/2016  . Encounter for preventive health examination 07/20/2016  . Tendonitis of elbow, left 07/20/2016    Katheran James, SPT 12/04/2016, 10:08 AM  [facility] [address]  [address] [place], [place], [address] Phone: [phone]   Fax:  [phone]  Name: Cody Bennett MRN: [id number] Date of Birth: 07-03-1971

## 2016-12-05 ENCOUNTER — Ambulatory Visit: Payer: Commercial Managed Care - PPO

## 2016-12-05 DIAGNOSIS — M25522 Pain in left elbow: Secondary | ICD-10-CM

## 2016-12-05 NOTE — Therapy (Signed)
[facility] [address]  [address] [place], [place], [address] Phone: [phone]   Fax:  [phone]  Physical Therapy Treatment  Patient Details  Name: Cody Bennett MRN: [id number] Date of Birth: 10/11/1971 Referring Provider: Dr. Rodolph BongEvan S. Corey, MD  Encounter Date: 12/05/2016      PT End of Session - 12/05/16 0853    Visit Number 5   Number of Visits 12   Date for PT Re-Evaluation 01/04/17   PT Start Time 0848   PT Stop Time 0930   PT Time Calculation (min) 42 min   Activity Tolerance Patient tolerated treatment well   Behavior During Therapy [facility] for tasks assessed/performed      Past Medical History:  Diagnosis Date  . Allergy   . Asthma     Past Surgical History:  Procedure Laterality Date  . KNEE SURGERY Left 2010 and 2012    There were no vitals filed for this visit.      Subjective Assessment - 12/05/16 0849    Subjective Pt. reporting he has continued to work out at the gym over the weekend with only minor pain occasionally at L elbow.   Patient Stated Goals "back to zero pain all of the time"   Currently in Pain? Yes   Pain Score 1    Pain Location Elbow   Pain Orientation Left;Medial   Pain Descriptors / Indicators Aching   Pain Type Chronic pain   Pain Onset More than a month ago   Pain Frequency Intermittent   Multiple Pain Sites No           [facility] Adult PT Treatment/Exercise - 12/05/16 0858      Elbow Exercises   Forearm Supination Left;Seated;15 reps   Forearm Supination Limitations Golf Club with 1# on end; holding at end of grip    Forearm Pronation Left;Seated;15 reps   Forearm Pronation Limitations golf club with 1# wt. on end; holding at end near grip    Wrist Flexion Left;20 reps;Seated   Bar Weights/Barbell (Wrist Flexion) 5 lbs   Wrist Flexion Limitations 2 sets   Wrist Extension Left;20 reps;Seated   Bar Weights/Barbell (Wrist Extension) 5 lbs     Shoulder  Exercises: Standing   Horizontal ABduction AROM;15 reps  3" hold   Theraband Level (Shoulder Horizontal ABduction) Level 3 (Green)   External Rotation 15 reps;AROM;Left  3" hold; with retraction   Theraband Level (Shoulder External Rotation) Level 3 (Green)   Flexion AROM;15 reps   Theraband Level (Shoulder Flexion) Level 3 (Green)   Flexion Limitations Y with green TB in door     Wrist Exercises   Wrist Radial Deviation AROM;20 reps;Seated;Left  reps held the same due to treatment yesterday   Bar Weights/Barbell (Radial Deviation) 5 lbs   Wrist Ulnar Deviation Left;20 reps;Seated;Theraband  good technique with this    Theraband Level (Ulnar Deviation) Level 3 (Green)   Other wrist exercises Seated L wrist extension stretch; 3 reps x 30"    Other wrist exercises Seated L wrist flexor stretch 3 x 30"      Cryotherapy   Number Minutes Cryotherapy 5 Minutes   Cryotherapy Location --  L medial Elbow    Type of Cryotherapy Ice massage     Manual Therapy   Manual Therapy Soft tissue mobilization   Soft tissue mobilization cross-friction massage across proximal flexor tendon (medial elbow)  PT Short Term Goals - 12/05/16 0951      PT SHORT TERM GOAL #1   Title Pt will be independent with initial HEP by 12/07/16.   Status Achieved           PT Long Term Goals - 12/04/16 1002      PT LONG TERM GOAL #1   Title pt will be independent with advanced HEP by 01/04/17   Status On-going     PT LONG TERM GOAL #2   Title Pt will report at least a 50% reduction in pain during normal daily activities and job tasks by 01/04/17.   Status On-going     PT LONG TERM GOAL #3   Title Pt will have improved L grip strength to within 5# of R grip strength to allow for improved function of the L UE by 01/04/17.   Status On-going     PT LONG TERM GOAL #4   Title Pt will report he is able to play his guitar for at least 1 hour without an increase in L medial elbow pain by  01/04/17.   Status Revised  Achieved 30 minutes on 12-04-16               Plan - 12/05/16 0853    Clinical Impression Statement Pt. leaving for weeklong snow skiing trip and will return to therapy on 3.5.18.  Pt. reporting consistent adherence to updated HEP 2x/day and feels he is improving with therapy.  Pt. reporting no soreness following yesterday's treatment thus scapular I's, T's, and Y's performed again today in standing with band.  Wrist, elbow strengthening continued today without progression to avoid over fatigue from consecutive therapy days.  Manual massage to medial flexor bundle and ice massage continued and tolerated well.  Will plan to f/u with pt. upon return from ski trip and progress HEP prn.   PT Treatment/Interventions Patient/family education;ADLs/Self Care Home Management;Cryotherapy;Electrical Stimulation;Moist Heat;Ultrasound;Therapeutic exercise;Manual techniques;Dry needling;Taping;Iontophoresis 4mg /ml Dexamethasone   PT Next Visit Plan HEP update prn; continue grip and wrist & elbow flexor strengthening with a focus on eccentric strengthening & scapular strengethening; STM to flexor bundle as indicated; manual therapy & modalities PRN      Patient will benefit from skilled therapeutic intervention in order to improve the following deficits and impairments:  Decreased activity tolerance, Decreased strength, Impaired flexibility, Impaired UE functional use, Pain  Visit Diagnosis: Pain in left elbow     Problem List Patient Active Problem List   Diagnosis Date Noted  . BMI 29.0-29.9,adult 07/20/2016  . Encounter for preventive health examination 07/20/2016  . Tendonitis of elbow, left 07/20/2016    Kermit Balo, PTA 12/05/16 4:54 PM   [facility] [address]  [address] [place], [place], [address] Phone: [phone]   Fax:  [phone]  Name: Cody Bennett MRN: [id number] Date of Birth:  1970/11/17

## 2016-12-17 ENCOUNTER — Ambulatory Visit: Payer: Commercial Managed Care - PPO | Attending: Family Medicine

## 2016-12-17 DIAGNOSIS — M25522 Pain in left elbow: Secondary | ICD-10-CM

## 2016-12-17 NOTE — Therapy (Signed)
[facility] [address]  [address] [place], [place], [address] Phone: [phone]   Fax:  [phone]  Physical Therapy Treatment  Patient Details  Name: Cody Bennett MRN: [id number] Date of Birth: 1970-12-05 Referring Provider: Dr. Rodolph Bong, MD  Encounter Date: 12/17/2016      PT End of Session - 12/17/16 0812    Visit Number 6   Number of Visits 12   Date for PT Re-Evaluation 01/04/17   PT Start Time 0804  Pt. arriving late    PT Stop Time 0843   PT Time Calculation (min) 39 min   Activity Tolerance Patient tolerated treatment well   Behavior During Therapy [facility] for tasks assessed/performed      Past Medical History:  Diagnosis Date  . Allergy   . Asthma     Past Surgical History:  Procedure Laterality Date  . KNEE SURGERY Left 2010 and 2012    There were no vitals filed for this visit.      Subjective Assessment - 12/17/16 0805    Subjective Pt. reporting L elbow gave him some pain while skiing in [place] however, "felt good overall".  Pt. reporting pain staying at low level and lasting short duration.  Pt. reporting he was able to get to, "most of the exercises" over vacation.    Patient Stated Goals "back to zero pain all of the time"   Currently in Pain? No/denies   Pain Score 0-No pain   Multiple Pain Sites No           [facility] Adult PT Treatment/Exercise - 12/17/16 0808      Elbow Exercises   Forearm Supination Left;Seated;20 reps  2 sets    Forearm Supination Limitations Golf club with 1# on end; hold at end of grip    Forearm Pronation Left;Seated;20 reps  2 sets    Forearm Pronation Limitations golf club with 1# at end; holding on end of grip   Wrist Flexion Left;Seated;15 reps   Bar Weights/Barbell (Wrist Flexion) --  6 lb   Wrist Flexion Limitations 2 sets    Wrist Extension Left;Seated;15 reps   Bar Weights/Barbell (Wrist Extension) --  6lbs   Wrist Extension Limitations 2  sets      Shoulder Exercises: Prone   Other Prone Exercises Prone on green p-ball (65cm) I's, T's, W's, Y's with 3# 5" x 15 reps     Shoulder Exercises: Standing   Horizontal ABduction AROM;10 reps  5" hold    Theraband Level (Shoulder Horizontal ABduction) Level 4 (Blue)   External Rotation AROM;Left;10 reps   Theraband Level (Shoulder External Rotation) Level 4 (Blue)   Flexion AROM;10 reps   Theraband Level (Shoulder Flexion) Level 4 (Blue)   Flexion Limitations Y      Shoulder Exercises: ROM/Strengthening   UBE (Upper Arm Bike) lvl 3.5 x 6' (3' fwd/3' back)     Wrist Exercises   Wrist Radial Deviation AROM;Seated;Left;15 reps   Bar Weights/Barbell (Radial Deviation) --  6 lbs    Wrist Ulnar Deviation Left;Theraband;15 reps;Standing   Bar Weights/Barbell (Ulnar Deviation) --  6lbs    Other wrist exercises Seated L wrist extension stretch; 2 reps x 30"    Other wrist exercises Seated L wrist flexor stretch 2 x 30"             PT Short Term Goals - 12/05/16 0951      PT SHORT TERM GOAL #1   Title Pt  will be independent with initial HEP by 12/07/16.   Status Achieved           PT Long Term Goals - 12/04/16 1002      PT LONG TERM GOAL #1   Title pt will be independent with advanced HEP by 01/04/17   Status On-going     PT LONG TERM GOAL #2   Title Pt will report at least a 50% reduction in pain during normal daily activities and job tasks by 01/04/17.   Status On-going     PT LONG TERM GOAL #3   Title Pt will have improved L grip strength to within 5# of R grip strength to allow for improved function of the L UE by 01/04/17.   Status On-going     PT LONG TERM GOAL #4   Title Pt will report he is able to play his guitar for at least 1 hour without an increase in L medial elbow pain by 01/04/17.   Status Revised  Achieved 30 minutes on 12-04-16               Plan - 12/17/16 0829    Clinical Impression Statement Pt. reporting only occasional low-level  pain while using ski poles on vacation in [place].  Pt. able to perform, "most exercises" over last two weeks on vacation.  Pt. noting he still feels he is improving and was pleased with performance of L UE with skiing.  Today's treatment with advancement of most all activities with pt. tolerating well.  I's, T's, and Y's prone on p-ball introduced and tolerated well.  Pt. reporting he is still performing ice massage and cross-friction massage at home daily.  Pt. will continue to benefit from further skilled therapy to improve activity tolerance and decrease pain with daily activities and job related tasks.      PT Treatment/Interventions Patient/family education;ADLs/Self Care Home Management;Cryotherapy;Electrical Stimulation;Moist Heat;Ultrasound;Therapeutic exercise;Manual techniques;Dry needling;Taping;Iontophoresis 4mg /ml Dexamethasone   PT Next Visit Plan Review HEP update prn; continue grip and wrist & elbow flexor strengthening with a focus on eccentric strengthening & scapular strengethening; STM to flexor bundle as indicated; manual therapy & modalities PRN      Patient will benefit from skilled therapeutic intervention in order to improve the following deficits and impairments:  Decreased activity tolerance, Decreased strength, Impaired flexibility, Impaired UE functional use, Pain  Visit Diagnosis: Pain in left elbow     Problem List Patient Active Problem List   Diagnosis Date Noted  . BMI 29.0-29.9,adult 07/20/2016  . Encounter for preventive health examination 07/20/2016  . Tendonitis of elbow, left 07/20/2016    Kermit BaloMicah Kaito [place]enburg, PTA 12/17/16 11:43 AM   [facility] [place] [address]  [address] [place], [place], [address] Phone: [phone]   Fax:  [phone]  Name: Cody Bennett MRN: [id number] Date of Birth: 09/05/1971

## 2016-12-19 ENCOUNTER — Ambulatory Visit: Payer: Commercial Managed Care - PPO | Admitting: Physical Therapy

## 2016-12-19 DIAGNOSIS — M25522 Pain in left elbow: Secondary | ICD-10-CM | POA: Diagnosis not present

## 2016-12-19 NOTE — Therapy (Signed)
[facility] [address]  [address] [place], [place], [address] Phone: [phone]   Fax:  [phone]  Physical Therapy Treatment  Patient Details  Name: Cody Bennett MRN: [id number] Date of Birth: 01/02/1971 Referring Provider: Dr. Rodolph BongEvan S. Corey, MD  Encounter Date: 12/19/2016      PT End of Session - 12/19/16 0809    Visit Number 7   Number of Visits 12   Date for PT Re-Evaluation 01/04/17   PT Start Time 0809  pt arrived late   PT Stop Time 0848   PT Time Calculation (min) 39 min   Activity Tolerance Patient tolerated treatment well   Behavior During Therapy [facility] for tasks assessed/performed      Past Medical History:  Diagnosis Date  . Allergy   . Asthma     Past Surgical History:  Procedure Laterality Date  . KNEE SURGERY Left 2010 and 2012    There were no vitals filed for this visit.      Subjective Assessment - 12/19/16 0811    Subjective Pt reporting no pain today, and noting pain is "less & less".   Patient Stated Goals "back to zero pain all of the time"   Currently in Pain? No/denies                         [facility]/Ex[facility] - 12/19/16 0809      Elbow Exercises   Forearm Supination Left;10 reps;Seated  2 sets   Forearm Supination Limitations golf club - 1# wt on end with grip at shaft/handle junction;, 2# with grip at mid shaft   Forearm Pronation Left;10 reps;Seated  2 sets   Forearm Pronation Limitations golf club - 1# wt on end with grip at shaft/handle junction;, 2# with grip at mid shaft     Shoulder Exercises: Prone   Extension Both;15 reps   Extension Weight (lbs) 3   Extension Limitations "I"   External Rotation Both;15 reps   External Rotation Weight (lbs) 3   External Rotation Limitations "W"   Horizontal ABduction 1 Both;15 reps;Weights   Horizontal ABduction 1 Weight (lbs) 3   Horizontal ABduction 1 Limitations "T"   Horizontal ABduction  2 Both;10 reps;Weights   Horizontal ABduction 2 Weight (lbs) 3   Horizontal ABduction 2 Limitations "Y"     Shoulder Exercises: Standing   Row Both;10 reps  2 sets   Row Limitations TRX - low & mid row     Shoulder Exercises: ROM/Strengthening   UBE (Upper Arm Bike) lvl 3.5 fwd/back x3' each   Pushups 10 reps  2 sets   Pushups Limitations 2nd set on green (65 cm) Pball     Hand Exercises   Other Hand Exercises Grip strengthening - yellow spring 35# 2x15     Wrist Exercises   Other wrist exercises Seated L wrist extension stretch; 2 reps x 30"    Other wrist exercises Seated L wrist flexor stretch 2 x 30"      Cryotherapy   Number Minutes Cryotherapy 5 Minutes   Cryotherapy Location --  L medial elbow   Type of Cryotherapy Ice massage     Manual Therapy   Manual Therapy Soft tissue mobilization   Soft tissue mobilization cross-friction massage across proximal flexor tendon (medial elbow)                   PT Short Term Goals - 12/05/16  1610      PT SHORT TERM GOAL #1   Title Pt will be independent with initial HEP by 12/07/16.   Status Achieved           PT Long Term Goals - 12/04/16 1002      PT LONG TERM GOAL #1   Title pt will be independent with advanced HEP by 01/04/17   Status On-going     PT LONG TERM GOAL #2   Title Pt will report at least a 50% reduction in pain during normal daily activities and job tasks by 01/04/17.   Status On-going     PT LONG TERM GOAL #3   Title Pt will have improved L grip strength to within 5# of R grip strength to allow for improved function of the L UE by 01/04/17.   Status On-going     PT LONG TERM GOAL #4   Title Pt will report he is able to play his guitar for at least 1 hour without an increase in L medial elbow pain by 01/04/17.   Status Revised  Achieved 30 minutes on 12-04-16               Plan - 12/19/16 0813    Clinical Impression Statement Pt demonstrating positive response to PT, noting pain  is becoming "less & less". Tolerated progression of forearm strengthening and addition of grip strengthening with some fatigue noted but no increased pain. Progressing with increasing scapular stabilization with increasingly unstable support (TRX rows & wall push-ups on Pball) and will plan to introduce body blade at next visit. Also discussed trial of ionto patch with pt wanting to wait until next visit due to already applied nitro patch this morning.   Rehab Potential Good   PT Treatment/Interventions Patient/family education;ADLs/Self Care Home Management;Cryotherapy;Electrical Stimulation;Moist Heat;Ultrasound;Therapeutic exercise;Manual techniques;Dry needling;Taping;Iontophoresis 4mg /ml Dexamethasone   PT Next Visit Plan Consider trial of ionto patch; Add body blade; Continue grip and wrist & elbow flexor strengthening with focus on eccentric strengthening & scapular strengethening; STM to flexor bundle as indicated; manual therapy & modalities PRN   Consulted and Agree with Plan of Care Patient      Patient will benefit from skilled therapeutic intervention in order to improve the following deficits and impairments:  Decreased activity tolerance, Decreased strength, Impaired flexibility, Impaired UE functional use, Pain  Visit Diagnosis: Pain in left elbow     Problem List Patient Active Problem List   Diagnosis Date Noted  . BMI 29.0-29.9,adult 07/20/2016  . Encounter for preventive health examination 07/20/2016  . Tendonitis of elbow, left 07/20/2016    Marry Guan, PT, MPT 12/19/2016, 9:06 AM  [facility] [address]  [address] [place], [place], [address] Phone: [phone]   Fax:  [phone]  Name: Cody Bennett MRN: [id number] Date of Birth: 30-Jun-1971

## 2016-12-25 ENCOUNTER — Ambulatory Visit: Payer: Commercial Managed Care - PPO

## 2016-12-28 ENCOUNTER — Ambulatory Visit: Payer: Commercial Managed Care - PPO | Admitting: Physical Therapy

## 2016-12-28 DIAGNOSIS — M25522 Pain in left elbow: Secondary | ICD-10-CM

## 2016-12-28 NOTE — Therapy (Signed)
[facility] [place] [address]  [address] [place], [place], [address] Phone: [phone]   Fax:  [phone]  Physical Therapy Treatment  Patient Details  Name: Cody Bennett MRN: [id number] Date of Birth: 06/27/1971 Referring Provider: Dr. Rodolph BongEvan S. Corey, MD  Encounter Date: 12/28/2016      PT End of Session - 12/28/16 0800    Visit Number 8   Number of Visits 12   Date for PT Re-Evaluation 01/04/17   PT Start Time 0800   PT Stop Time 0843   PT Time Calculation (min) 43 min   Activity Tolerance Patient tolerated treatment well   Behavior During Therapy [facility] for tasks assessed/performed      Past Medical History:  Diagnosis Date  . Allergy   . Asthma     Past Surgical History:  Procedure Laterality Date  . KNEE SURGERY Left 2010 and 2012    There were no vitals filed for this visit.      Subjective Assessment - 12/28/16 0803    Subjective Pt reports pain has remained minimal to absent even with dragging suitcases through airports recently.   Patient Stated Goals "back to zero pain all of the time"   Currently in Pain? No/denies                         [facility] PT Treatment/Exercise - 12/28/16 0800      Elbow Exercises   Forearm Supination Left;20 reps   Forearm Supination Limitations hammer held at distal end   Forearm Pronation Left;20 reps   Forearm Pronation Limitations hammer held at distal end     Shoulder Exercises: Standing   Row Both;15 reps   Row Limitations TRX - low & mid rows, mid row + ER     Shoulder Exercises: ROM/Strengthening   UBE (Upper Arm Bike) lvl 3.5 fwd/back x3' each   Wall Pushups 15 reps  2 sets   Wall Pushups Limitations on green (65 cm) Pball   Pushups --   Pushups Limitations --     Shoulder Exercises: Body Blade   Flexion 30 seconds;2 reps   Flexion Limitations stationary with arm extended at 90 dg flexion & moving from 0-90 dg flexion   External  Rotation 30 seconds;1 rep   Internal Rotation 30 seconds;1 rep   Other Body Blade Exercises Pronation & supination at 90 dg shoulder flexion x30" each   Other Body Blade Exercises Moving through IR/ER with neutral shouder & pronation/supination at 90 dg shoudler flexion x30" each     Hand Exercises   Other Hand Exercises Grip strengthening - yellow spring 35# 2x15     Wrist Exercises   Wrist Ulnar Deviation Left;20 reps;Theraband   Theraband Level (Ulnar Deviation) Level 4 (Blue)   Other wrist exercises Seated L wrist extension stretch; 2 reps x 30"    Other wrist exercises Seated L wrist flexor stretch 2 x 30"                 PT Education - 12/28/16 0844    Education provided Yes   Education Details HEP update   Person(s) Educated Patient   Methods Explanation;Demonstration;Handout   Comprehension Verbalized understanding;Returned demonstration          PT Short Term Goals - 12/05/16 0951      PT SHORT TERM GOAL #1   Title Pt will be independent with initial HEP by 12/07/16.   Status  Achieved           PT Long Term Goals - 12/04/16 1002      PT LONG TERM GOAL #1   Title pt will be independent with advanced HEP by 01/04/17   Status On-going     PT LONG TERM GOAL #2   Title Pt will report at least a 50% reduction in pain during normal daily activities and job tasks by 01/04/17.   Status On-going     PT LONG TERM GOAL #3   Title Pt will have improved L grip strength to within 5# of R grip strength to allow for improved function of the L UE by 01/04/17.   Status On-going     PT LONG TERM GOAL #4   Title Pt will report he is able to play his guitar for at least 1 hour without an increase in L medial elbow pain by 01/04/17.   Status Revised  Achieved 30 minutes on 12-04-16               Plan - 12/28/16 0805    Clinical Impression Statement Pt noting continued improvement with lessening pain at elbow, returning from traveling and noting no pain with  toting luggage through airports. Now only noting pain with heavy lifting. Dicsussed option of initiating transition to HEP, with pt wishing to continue with PT at least one more week. HEP reviewed and updated as appropriate.   Rehab Potential Good   PT Treatment/Interventions Patient/family education;ADLs/Self Care Home Management;Cryotherapy;Electrical Stimulation;Moist Heat;Ultrasound;Therapeutic exercise;Manual techniques;Dry needling;Taping;Iontophoresis 4mg /ml Dexamethasone   PT Next Visit Plan Consider trial of ionto patch; Body blade; Continue grip and wrist & elbow flexor strengthening with focus on eccentric strengthening & scapular strengethening; STM to flexor bundle as indicated; manual therapy & modalities PRN   Consulted and Agree with Plan of Care Patient      Patient will benefit from skilled therapeutic intervention in order to improve the following deficits and impairments:  Decreased activity tolerance, Decreased strength, Impaired flexibility, Impaired UE functional use, Pain  Visit Diagnosis: Pain in left elbow     Problem List Patient Active Problem List   Diagnosis Date Noted  . BMI 29.0-29.9,adult 07/20/2016  . Encounter for preventive health examination 07/20/2016  . Tendonitis of elbow, left 07/20/2016    Marry Guan, PT, MPT 12/28/2016, 8:47 AM  [facility] [address]  [address] [place], [place], [address] Phone: [phone]   Fax:  [phone]  Name: Cody Bennett MRN: [id number] Date of Birth: 04/25/1971

## 2017-01-02 ENCOUNTER — Ambulatory Visit: Payer: Commercial Managed Care - PPO

## 2017-01-02 DIAGNOSIS — M25522 Pain in left elbow: Secondary | ICD-10-CM | POA: Diagnosis not present

## 2017-01-02 NOTE — Patient Instructions (Signed)

## 2017-01-02 NOTE — Therapy (Signed)
[facility] [address]  [address] [place], [place], [address] Phone: [phone]   Fax:  [phone]  Physical Therapy Treatment  Patient Details  Name: Cody Bennett MRN: [id number] Date of Birth: 1971-01-13 Referring Provider: Dr. Rodolph Bong, MD  Encounter Date: 01/02/2017      PT End of Session - 01/02/17 0853    Visit Number 9   Number of Visits 12   Date for PT Re-Evaluation 01/04/17   PT Start Time 0846   PT Stop Time 0935   PT Time Calculation (min) 49 min   Activity Tolerance Patient tolerated treatment well   Behavior During Therapy [facility] for tasks assessed/performed      Past Medical History:  Diagnosis Date  . Allergy   . Asthma     Past Surgical History:  Procedure Laterality Date  . KNEE SURGERY Left 2010 and 2012    There were no vitals filed for this visit.      Subjective Assessment - 01/02/17 0845    Subjective Pt. reporting occasional pain with heavy lifting.  Occasiaonal pain with repetitive motion.     Patient Stated Goals "back to zero pain all of the time"   Currently in Pain? No/denies   Pain Score 0-No pain   Multiple Pain Sites No                         [facility] Adult PT Treatment/Exercise - 01/02/17 0850      Elbow Exercises   Forearm Supination Left;15 reps   Forearm Supination Limitations Golf club 2#    Forearm Pronation Left;15 reps   Forearm Pronation Limitations golf club 2#    Wrist Flexion Left;Seated;15 reps   Bar Weights/Barbell (Wrist Flexion) Other (comment)   Wrist Flexion Limitations 6#    Wrist Extension Left;Seated;15 reps   Bar Weights/Barbell (Wrist Extension) Other (comment)  6#    Wrist Extension Limitations --  6#      Shoulder Exercises: Prone   Extension Both;15 reps   Extension Weight (lbs) 3   Extension Limitations "I"   External Rotation Both;15 reps   External Rotation Weight (lbs) 3   External Rotation Limitations "W"    Horizontal ABduction 1 Both;15 reps;Weights   Horizontal ABduction 1 Weight (lbs) 3   Horizontal ABduction 1 Limitations "T"   Horizontal ABduction 2 Both;10 reps;Weights   Horizontal ABduction 2 Weight (lbs) 3     Shoulder Exercises: Standing   Horizontal ABduction --   Theraband Level (Shoulder Horizontal ABduction) --   External Rotation AROM;Left;15 reps  2 sets    Theraband Level (Shoulder External Rotation) Level 4 (Blue)   Internal Rotation AROM;Left;15 reps  2 sets    Theraband Level (Shoulder Internal Rotation) Level 4 (Blue)     Shoulder Exercises: ROM/Strengthening   UBE (Upper Arm Bike) lvl 4.0  fwd/back x 3' each   Wall Pushups 15 reps   Wall Pushups Limitations on green (65 cm) Pball     Shoulder Exercises: Body Blade   Flexion 30 seconds;2 reps   Flexion Limitations stationary with arm extended at 90 dg flexion & moving from 0-90 dg flexion   External Rotation 30 seconds;2 reps  neutral    Internal Rotation 30 seconds;2 reps  neutral    Other Body Blade Exercises B shoulder flexion overhead 90 dg <> 160 dg      Hand Exercises   Other Hand Exercises Grip  strengthening - yellow spring 35# 2x15  5" hold with slow eccentric      Wrist Exercises   Other wrist exercises Seated L wrist extension stretch; 2 reps x 30"    Other wrist exercises Seated L wrist flexor stretch 2 x 30"      Modalities   Modalities Iontophoresis     Iontophoresis   Type of Iontophoresis Dexamethasone   Dose 1.0 ml, 2380mA-min    Time 4-6 hr wear time                 PT Education - 01/02/17 0940    Education provided Yes   Education Details iontophoresis education/precuations handout discussed with pt. with pt. verbalizing understanding   Person(s) Educated Patient   Methods Explanation;Demonstration;Handout;Verbal cues   Comprehension Verbalized understanding;Verbal cues required          PT Short Term Goals - 12/05/16 0951      PT SHORT TERM GOAL #1   Title Pt  will be independent with initial HEP by 12/07/16.   Status Achieved           PT Long Term Goals - 12/04/16 1002      PT LONG TERM GOAL #1   Title pt will be independent with advanced HEP by 01/04/17   Status On-going     PT LONG TERM GOAL #2   Title Pt will report at least a 50% reduction in pain during normal daily activities and job tasks by 01/04/17.   Status On-going     PT LONG TERM GOAL #3   Title Pt will have improved L grip strength to within 5# of R grip strength to allow for improved function of the L UE by 01/04/17.   Status On-going     PT LONG TERM GOAL #4   Title Pt will report he is able to play his guitar for at least 1 hour without an increase in L medial elbow pain by 01/04/17.   Status Revised  Achieved 30 minutes on 12-04-16               Plan - 01/02/17 [id number]    Clinical Impression Statement Pt. reporting he is still performing all HEP activities with exception of ice massage.  Pt. only with occasional pain while lifting heavy objects now and feels he is still improving with therapy.  Pt. verbalizing he will be comfortable transitioning to home program following next visit, which will be 10th visit in POC.  Therex progressed today with mild onset of L medial elbow pain following pronation/supination activity.  Pain quickly recovering.  Trial of iontophoresis applied to L medial elbow today to further reduce post-exercise swelling and pain.  Iontophoresis precautions and education handout discussed with and issued to pt. to end treatment.  Pt. anticipating d/c next visit.     PT Treatment/Interventions Patient/family education;ADLs/Self Care Home Management;Cryotherapy;Electrical Stimulation;Moist Heat;Ultrasound;Therapeutic exercise;Manual techniques;Dry needling;Taping;Iontophoresis 4mg /ml Dexamethasone   PT Next Visit Plan 10th visit FOTO; d/c; Monitor response to ionto patch      Patient will benefit from skilled therapeutic intervention in order to  improve the following deficits and impairments:  Decreased activity tolerance, Decreased strength, Impaired flexibility, Impaired UE functional use, Pain  Visit Diagnosis: Pain in left elbow     Problem List Patient Active Problem List   Diagnosis Date Noted  . BMI 29.0-29.9,adult 07/20/2016  . Encounter for preventive health examination 07/20/2016  . Tendonitis of elbow, left 07/20/2016    Kermit BaloMicah Ramiz Turpin, PTA  01/02/17 10:54 AM  [facility] [address]  [address] [place], [place], [address] Phone: [phone]   Fax:  [phone]  Name: Sajad Glander MRN: [id number] Date of Birth: 06-24-1971

## 2017-01-04 ENCOUNTER — Ambulatory Visit: Payer: Commercial Managed Care - PPO | Admitting: Physical Therapy

## 2017-01-04 DIAGNOSIS — M25522 Pain in left elbow: Secondary | ICD-10-CM

## 2017-01-04 NOTE — Therapy (Signed)
[facility] [address]  [place], [place], [address] Phone: [phone]   Fax:  [phone]  Physical Therapy Treatment  Patient Details  Name: Cody Bennett MRN: [id number] Date of Birth: Apr 26, 1971 Referring Provider: Dr. Gregor Hams, MD  Encounter Date: 01/04/2017      PT End of Session - 01/04/17 0802    Visit Number 10   Number of Visits 12   Date for PT Re-Evaluation 01/04/17   PT Start Time 0802   PT Stop Time 0840   PT Time Calculation (min) 38 min   Activity Tolerance Patient tolerated treatment well   Behavior During Therapy [facility] for tasks assessed/performed      Past Medical History:  Diagnosis Date  . Allergy   . Asthma     Past Surgical History:  Procedure Laterality Date  . KNEE SURGERY Left 2010 and 2012    There were no vitals filed for this visit.      Subjective Assessment - 01/04/17 0805    Subjective Pt reports the ionto patch was "magic". No pain since application.   Patient Stated Goals "back to zero pain all of the time"   Currently in Pain? No/denies            [facility] PT Assessment - 01/04/17 0802      Assessment   Medical Diagnosis Tendonitis of Elbow, Left   Referring Provider Dr. Gregor Hams, MD   Onset Date/Surgical Date --  Last Spring- April/May   Hand Dominance Left     Observation/Other Assessments   Focus on Therapeutic Outcomes (FOTO)  Elbow - 88% (12% limitation)      ROM / Strength   AROM / PROM / Strength Strength     Strength   Strength Assessment Site Elbow;Forearm;Wrist;Hand   Right Elbow Flexion 5/5   Right Elbow Extension 5/5   Left Elbow Flexion 5/5   Left Elbow Extension 5/5   Right Forearm Pronation 5/5   Right Forearm Supination 5/5   Left Forearm Pronation 5/5   Left Forearm Supination 5/5   Right Wrist Flexion 5/5   Right Wrist Extension 5/5   Right Wrist Radial Deviation 5/5   Right Wrist Ulnar Deviation 5/5   Left Wrist  Flexion 5/5   Left Wrist Extension 5/5   Left Wrist Radial Deviation 5/5   Left Wrist Ulnar Deviation 5/5   Right Hand Grip (lbs) 61# (65, 58, 60)   Left Hand Grip (lbs) 60.6# (64, 57, 61)                     [facility] Adult PT Treatment/Exercise - 01/04/17 0802      Shoulder Exercises: Standing   Row Both;15 reps  each   Row Limitations TRX - low & mid rows, mid row + ER     Shoulder Exercises: ROM/Strengthening   UBE (Upper Arm Bike) lvl 4.0  fwd/back x 3' each   Pushups 15 reps   Pushups Limitations TRX     Modalities   Modalities Iontophoresis     Iontophoresis   Type of Iontophoresis Dexamethasone   Location L medial elbow   Dose 1.0 ml, 83m-min    Time 4-6 hr wear time                   PT Short Term Goals - 12/05/16 0951      PT SHORT TERM GOAL #1   Title Pt  will be independent with initial HEP by 12/07/16.   Status Achieved           PT Long Term Goals - 01/04/17 4010      PT LONG TERM GOAL #1   Title pt will be independent with advanced HEP by 01/04/17   Status Achieved     PT LONG TERM GOAL #2   Title Pt will report at least a 50% reduction in pain during normal daily activities and job tasks by 01/04/17.   Status Achieved     PT LONG TERM GOAL #3   Title Pt will have improved L grip strength to within 5# of R grip strength to allow for improved function of the L UE by 01/04/17.   Status Achieved     PT LONG TERM GOAL #4   Title Pt will report he is able to play his guitar for at least 1 hour without an increase in L medial elbow pain by 01/04/17.   Status Achieved  Achieved 30 minutes on 12-04-16               Plan - 01/04/17 2725    Clinical Impression Statement Pt noting benefit from ionto patch last visit with almost disbelief in the fact that no pain afterward. Pt pleased with progress with PT and feels ready to transition to HEP. Pt denies questions/concerns with existing HEP, but inquiring about use of TRX at home,  therefore reviewed TRX rows and push-ups with pt demostrating good technique. All goals met for this episode and pt in agreement with transition to HEP, therefore will proceed with D/C from PT for this episode.   Rehab Potential Good   PT Treatment/Interventions Patient/family education;ADLs/Self Care Home Management;Cryotherapy;Electrical Stimulation;Moist Heat;Ultrasound;Therapeutic exercise;Manual techniques;Dry needling;Taping;Iontophoresis 59m/ml Dexamethasone   PT Next Visit Plan Discharge   Consulted and Agree with Plan of Care Patient      Patient will benefit from skilled therapeutic intervention in order to improve the following deficits and impairments:  Decreased activity tolerance, Decreased strength, Impaired flexibility, Impaired UE functional use, Pain  Visit Diagnosis: Pain in left elbow     Problem List Patient Active Problem List   Diagnosis Date Noted  . BMI 29.0-29.9,adult 07/20/2016  . Encounter for preventive health examination 07/20/2016  . Tendonitis of elbow, left 07/20/2016    PHYSICAL THERAPY DISCHARGE SUMMARY  Visits from Start of Care: 10  Current functional level related to goals / functional outcomes:   Refer to above clinical impression.   Remaining deficits:   As above.   Education / Equipment:   HEP  Plan: Patient agrees to discharge.  Patient goals were met. Patient is being discharged due to meeting the stated rehab goals.  ?????      JPercival Spanish PT, MPT 01/04/2017, 8:44 AM  [facility] [place] [address]Phone: [phone]  Fax:  [phone] Name: Cody TaaffeMRN: [id number] of Birth: 1January 28, 1972

## 2017-02-09 ENCOUNTER — Other Ambulatory Visit: Payer: Self-pay | Admitting: Family Medicine

## 2017-02-09 DIAGNOSIS — M778 Other enthesopathies, not elsewhere classified: Secondary | ICD-10-CM

## 2017-03-21 ENCOUNTER — Other Ambulatory Visit: Payer: Self-pay | Admitting: Family Medicine

## 2017-03-21 DIAGNOSIS — M778 Other enthesopathies, not elsewhere classified: Secondary | ICD-10-CM

## 2017-04-30 ENCOUNTER — Ambulatory Visit (INDEPENDENT_AMBULATORY_CARE_PROVIDER_SITE_OTHER): Payer: Commercial Managed Care - PPO | Admitting: Family Medicine

## 2017-04-30 ENCOUNTER — Encounter: Payer: Self-pay | Admitting: Family Medicine

## 2017-04-30 VITALS — BP 119/84 | HR 77 | Temp 98.7°F | Resp 20 | Ht 71.0 in | Wt 212.8 lb

## 2017-04-30 DIAGNOSIS — G5622 Lesion of ulnar nerve, left upper limb: Secondary | ICD-10-CM | POA: Diagnosis not present

## 2017-04-30 DIAGNOSIS — S66912A Strain of unspecified muscle, fascia and tendon at wrist and hand level, left hand, initial encounter: Secondary | ICD-10-CM | POA: Diagnosis not present

## 2017-04-30 MED ORDER — NAPROXEN 500 MG PO TABS: 500.0000 mg | ORAL_TABLET | Freq: Two times a day (BID) | ORAL | 0 refills | Status: DC

## 2017-04-30 NOTE — Patient Instructions (Signed)
Take naproxen 500 mg every 12 hours with food for 3-7 days scheduled. Then as needed only for pain or injury.    This is a strain, so keeping it wrapped for a few days to allow healing will be beneficial. Then encourage range of motion.     Guyon Tunnel Syndrome Guyon tunnel syndrome, also known as cyclist's palsy, is a disorder that causes pain, weakness, and numbness in the wrist and the hand. Pain occurs in the outer (ulnar) part of the wrist and the palm of the hand, including the ring finger and pinkie. This condition happens when a nerve in the arm and hand (ulnar nerve) is stretched or squeezed (compressed) at the base of the hand. Over time, you may start to have symptoms with just a small amount of stress to your hand or wrist. When it takes less stress to cause symptoms than it used to, this is called sensitization. Guyon tunnel syndrome is common among cyclists, because gripping and leaning on bicycle handlebars for long periods of time can cause this condition and make it worse over time. What are the causes? This condition may be caused by:  Repetitive pressure on the hands and wrists.  An injuryto the base of the hand that causes swelling or a break (fracture) in a wrist bone (hamate bone).  What increases the risk? The following factors may make you more likely to develop this condition:  Having diabetes.  Having hypothyroidism.  Participating in activities that involve repeated impact, pressure, or vibration of the hands or wrists. These include certain sports, such as cycling, tennis, and martial arts.  Repeatedly bearing weight in your hands, such as when you use crutches.  Having gout or rheumatoid arthritis.  Having a ganglion cyst or lipoma near the base of the hand.  Having carpal tunnel syndrome.  What are the signs or symptoms? Symptoms of this condition may include:  Tingling, numbness, or a burning feeling in the ulnar side of the palm and in the ring  finger and pinkie.  Pain in the hand or wrist. Pain may feel sharp, or it may feel like an ache.  Weakness in the hand. You may have difficulty gripping objects.  Involuntary bending of the ring finger and pinkie toward the palm (claw hand).  How is this diagnosed? This condition may be diagnosed based on:  A physical exam.  Your medical history.  Tests, such as: ? Nerve conduction test. This test checks the quality of signals along your ulnar nerve. ? X-rays. ? CT scan. ? Ultrasound. This uses sound waves to make an image of your affected area.  How is this treated? Treatment for this condition may include:  Resting the injured area. This may include stopping or modifying any sports and physical activity for a period of time.  Icing the injured area.  Medicines that help to relieve pain and inflammation.  A splint to keep your wrist and hand in the proper position.  Physical therapy.  An injection of medicine (cortisone) that helps to reduce inflammation.  Surgery to relieve pressure on the ulnar nerve. This is done only in very severe cases.  Follow these instructions at home: If you have a splint:  Do not put pressure on any part of the splint until it is fully hardened. This may take several hours.  Wear it as told by your health care provider. Remove it only as told by your health care provider.  Loosen the splint if your fingers tingle,  become numb, or turn cold and blue.  Do not let your splint get wet if it is not waterproof. ? If your splint is not waterproof, cover it with a watertight covering when you take a bath or shower.  Keep the splint clean.  Ask your health care provider when it is safe for you to drive. Managing pain, stiffness, and swelling  If directed, apply ice to the injured area: ? Put ice in a plastic bag. ? Place a towel between your skin and the bag. ? Leave the ice on for 20 minutes, 2-3 times a day. Activity  Return to your  normal activities as told by your health care provider. Ask your health care provider what activities are safe for you.  When you do activities that cause stress to your hands and wrists, try wearing padded gloves. Change your hand positions often, especially when you do these activities for a long time.  If physical therapy was prescribed, do exercises as told by your health care provider. General instructions  Take over-the-counter and prescription medicines only as told by your health care provider.  Keep all follow-up visits as told by your health care provider. This is important. Contact a health care provider if:  You have pain, tingling, or weakness that gets worse.  Your symptoms do not improve after 2 weeks of treatment. This information is not intended to replace advice given to you by your health care provider. Make sure you discuss any questions you have with your health care provider. Document Released: 10/01/2005 Document Revised: 05/30/2016 Document Reviewed: 06/13/2015 Elsevier Interactive Patient Education  [employer].

## 2017-04-30 NOTE — Progress Notes (Signed)
Cody Bennett , 08/27/1971, 46 y.o., male MRN: [id number] Patient Care Team    Relationship Specialty Notifications Start End  Cody Bennett, Cody Lina A, DO PCP - General Family Medicine  06/19/16     Chief Complaint  Patient presents with  . Wrist Pain    x 1 week left      Subjective: Pt presents for an OV with complaints of left wrist pain  of 1 week  duration.  Associated symptoms include mild swelling and tenderness ulnar aspect of forearm. He endorses mild wrist discomfort last week after lifting Bennett heavy object out if his trunk. He has noticed intermittent decrease sensation left 5 th finger tip, discomfort with squeezing/gripping and rotation of forearm. He has noticed an improvement over the last 24 hours. Pt has tried nothing to ease their symptoms. He had left elbow tendonitis that responded well to treatment, therapy and nitro patches. He is left handed.   [facility] 07/20/2016 06/19/2016  Decreased Interest 0 0  Down, Depressed, Hopeless 0 0  PHQ - 2 Score 0 0    Allergies  Allergen Reactions  . Apple Swelling  . Peach [Prunus Persica] Swelling   Social History  Substance Use Topics  . Smoking status: Never Smoker  . Smokeless tobacco: Never Used  . Alcohol use 3.6 oz/week    6 Glasses of wine per week   Past Medical History:  Diagnosis Date  . Allergy   . Asthma    Past Surgical History:  Procedure Laterality Date  . KNEE SURGERY Left 2010 and 2012   Family History  Problem Relation Age of Onset  . Hearing loss Mother   . Stroke Brother   . Arthritis Maternal Aunt   . Mental illness Maternal Aunt   . Arthritis Paternal Uncle   . Heart disease Paternal Uncle   . Asthma Maternal Grandmother   . Hearing loss Maternal Grandmother   . Mental illness Maternal Grandfather    Allergies as of 04/30/2017      Reactions   Apple Swelling   Peach [prunus Persica] Swelling      Medication List       Accurate as of 04/30/17  8:16 AM. Always use your  most recent med list.          cetirizine 10 MG tablet Commonly known as:  ZYRTEC Take 10 mg by mouth daily as needed for allergies.   montelukast 10 MG tablet Commonly known as:  SINGULAIR Take 1 tablet (10 mg total) by mouth at bedtime.   nitroGLYCERIN 0.2 mg/hr patch Commonly known as:  NITRODUR - Dosed in mg/24 hr Needs f/u       All past medical history, surgical history, allergies, family history, immunizations andmedications were updated in the EMR today and reviewed under the history and medication portions of their EMR.     ROS: Negative, with the exception of above mentioned in HPI   Objective:  BP 119/84 (BP Location: Left Arm, Patient Position: Sitting, Cuff Size: Large)   Pulse 77   Temp 98.7 F (37.1 C)   Resp 20   Ht 5\' 11"  (1.803 m)   Wt 212 lb 12 oz (96.5 kg)   SpO2 98%   BMI 29.67 kg/m  Body mass index is 29.67 kg/m. Gen: Afebrile. No acute distress. Nontoxic in appearance, well developed, well nourished.  MSK: no erythema, no soft tissue swelling. TTP left wrist ulnar aspect over Guyons canal and lateral distal forearm. NROM with  mild discomfort on resisted supination and pronation. Negative tinel. NV intact distally.  Neuro: Normal gait. PERLA. EOMi. Alert. Oriented x3  No exam data present No results found. No results found for this or any previous visit (from the past 24 hour(s)).  Assessment/Plan: Cody Bennett is Bennett 46 y.o. male present for OV for  Strain of left wrist, initial encounter Guyon syndrome, left - wrist strain with mild intermittent Guyon syndrome.  - Keep ACE wrap on wrist for 2-3 days, with use of naproxen scheduled for 3-7 days. After day 3 encourage NROM.  - AVS education on Guyons syndrome provided.  F/u 2-4 weeks prn   Reviewed expectations re: course of current medical issues.  Discussed self-management of symptoms.  Outlined signs and symptoms indicating need for more acute intervention.  Patient verbalized  understanding and all questions were answered.  Patient received an After-Visit Summary.    No orders of the defined types were placed in this encounter.    Note is dictated utilizing voice recognition software. Although note has been proof read prior to signing, occasional typographical errors still can be missed. If any questions arise, please do not hesitate to call for verification.   electronically signed by:  Cody Pacini, DO  [facility] [place]

## 2017-07-23 ENCOUNTER — Encounter: Payer: Self-pay | Admitting: *Deleted

## 2017-07-23 ENCOUNTER — Other Ambulatory Visit: Payer: Self-pay | Admitting: *Deleted

## 2017-07-23 MED ORDER — MONTELUKAST SODIUM 10 MG PO TABS: 10.0000 mg | ORAL_TABLET | Freq: Every day | ORAL | 0 refills | Status: DC

## 2017-08-13 ENCOUNTER — Ambulatory Visit (INDEPENDENT_AMBULATORY_CARE_PROVIDER_SITE_OTHER): Payer: Commercial Managed Care - PPO | Admitting: Family Medicine

## 2017-08-13 ENCOUNTER — Encounter: Payer: Self-pay | Admitting: Family Medicine

## 2017-08-13 VITALS — BP 130/88 | HR 68 | Temp 98.6°F | Resp 20 | Ht 71.0 in | Wt 218.5 lb

## 2017-08-13 DIAGNOSIS — J3089 Other allergic rhinitis: Secondary | ICD-10-CM

## 2017-08-13 DIAGNOSIS — J4520 Mild intermittent asthma, uncomplicated: Secondary | ICD-10-CM

## 2017-08-13 DIAGNOSIS — R05 Cough: Secondary | ICD-10-CM | POA: Diagnosis not present

## 2017-08-13 DIAGNOSIS — R059 Cough, unspecified: Secondary | ICD-10-CM

## 2017-08-13 MED ORDER — ALBUTEROL SULFATE HFA 108 (90 BASE) MCG/ACT IN AERS: 2.0000 | INHALATION_SPRAY | Freq: Four times a day (QID) | RESPIRATORY_TRACT | 2 refills | Status: AC | PRN

## 2017-08-13 MED ORDER — MONTELUKAST SODIUM 10 MG PO TABS: 10.0000 mg | ORAL_TABLET | Freq: Every day | ORAL | 3 refills | Status: DC

## 2017-08-13 MED ORDER — FLUTICASONE PROPIONATE 50 MCG/ACT NA SUSP: 2.0000 | Freq: Every day | NASAL | 6 refills | Status: DC

## 2017-08-13 MED ORDER — CETIRIZINE HCL 10 MG PO TABS: 10.0000 mg | ORAL_TABLET | Freq: Every day | ORAL | 3 refills | Status: DC | PRN

## 2017-08-13 NOTE — Patient Instructions (Addendum)
Continue zyrtec and Singulair daily.  Start Flonase nasal spray daily as prescribed for a few weeks even after improvement.  Albuterol inhaler called in for you. Use every 6 hours as needed when sick for chest tightness or wheezing.  Start mucinex DM for help decreasing drainage and cough.  Change filters in the house.  Humidifier use.   Allergic Rhinitis Allergic rhinitis is when the mucous membranes in the nose respond to allergens. Allergens are particles in the air that cause your body to have an allergic reaction. This causes you to release allergic antibodies. Through a chain of events, these eventually cause you to release histamine into the blood stream. Although meant to protect the body, it is this release of histamine that causes your discomfort, such as frequent sneezing, congestion, and an itchy, runny nose. What are the causes? Seasonal allergic rhinitis (hay fever) is caused by pollen allergens that may come from grasses, trees, and weeds. Year-round allergic rhinitis (perennial allergic rhinitis) is caused by allergens such as house dust mites, pet dander, and mold spores. What are the signs or symptoms?  Nasal stuffiness (congestion).  Itchy, runny nose with sneezing and tearing of the eyes. How is this diagnosed? Your health care provider can help you determine the allergen or allergens that trigger your symptoms. If you and your health care provider are unable to determine the allergen, skin or blood testing may be used. Your health care provider will diagnose your condition after taking your health history and performing a physical exam. Your health care provider may assess you for other related conditions, such as asthma, pink eye, or an ear infection. How is this treated? Allergic rhinitis does not have a cure, but it can be controlled by:  Medicines that block allergy symptoms. These may include allergy shots, nasal sprays, and oral antihistamines.  Avoiding the  allergen.  Hay fever may often be treated with antihistamines in pill or nasal spray forms. Antihistamines block the effects of histamine. There are over-the-counter medicines that may help with nasal congestion and swelling around the eyes. Check with your health care provider before taking or giving this medicine. If avoiding the allergen or the medicine prescribed do not work, there are many new medicines your health care provider can prescribe. Stronger medicine may be used if initial measures are ineffective. Desensitizing injections can be used if medicine and avoidance does not work. Desensitization is when a patient is given ongoing shots until the body becomes less sensitive to the allergen. Make sure you follow up with your health care provider if problems continue. Follow these instructions at home: It is not possible to completely avoid allergens, but you can reduce your symptoms by taking steps to limit your exposure to them. It helps to know exactly what you are allergic to so that you can avoid your specific triggers. Contact a health care provider if:  You have a fever.  You develop a cough that does not stop easily (persistent).  You have shortness of breath.  You start wheezing.  Symptoms interfere with normal daily activities. This information is not intended to replace advice given to you by your health care provider. Make sure you discuss any questions you have with your health care provider. Document Released: 06/26/2001 Document Revised: 06/01/2016 Document Reviewed: 06/08/2013 Elsevier Interactive Patient Education  2017 [employer]

## 2017-08-13 NOTE — Progress Notes (Signed)
Cody Bennett , 01/06/1971, 46 y.o., male MRN: [id number] Patient Care Team    Relationship Specialty Notifications Start End  Natalia LeatherwoodKuneff, Desirae Mancusi A, DO PCP - General Family Medicine  06/19/16     Chief Complaint  Patient presents with  . Cough    post nasal drip     Subjective: Pt presents for an OV with complaints of cough of a 10 days duration.  Associated symptoms include cough, rhinorrhea, nasal congestion, chest tightness. He denies fever, chills, nausea or vomit. He has a h/o asthma but has not used his inhaler in years. Pt has tried routinely taking zyrtec and singulair to ease their symptoms.   [facility] 08/13/2017 07/20/2016 06/19/2016  Decreased Interest 0 0 0  Down, Depressed, Hopeless 0 0 0  PHQ - 2 Score 0 0 0    Allergies  Allergen Reactions  . Apple Swelling  . Peach [Prunus Persica] Swelling   Social History  Substance Use Topics  . Smoking status: Never Smoker  . Smokeless tobacco: Never Used  . Alcohol use 3.6 oz/week    6 Glasses of wine per week   Past Medical History:  Diagnosis Date  . Allergy   . Asthma    Past Surgical History:  Procedure Laterality Date  . KNEE SURGERY Left 2010 and 2012   Family History  Problem Relation Age of Onset  . Hearing loss Mother   . Stroke Brother   . Arthritis Maternal Aunt   . Mental illness Maternal Aunt   . Arthritis Paternal Uncle   . Heart disease Paternal Uncle   . Asthma Maternal Grandmother   . Hearing loss Maternal Grandmother   . Mental illness Maternal Grandfather    Allergies as of 08/13/2017      Reactions   Apple Swelling   Peach [prunus Persica] Swelling      Medication List       Accurate as of 08/13/17  8:08 AM. Always use your most recent med list.          cetirizine 10 MG tablet Commonly known as:  ZYRTEC Take 10 mg by mouth daily as needed for allergies.   montelukast 10 MG tablet Commonly known as:  SINGULAIR Take 1 tablet (10 mg total) by mouth at  bedtime.   naproxen 500 MG tablet Commonly known as:  NAPROSYN Take 1 tablet (500 mg total) by mouth 2 (two) times daily with a meal.       All past medical history, surgical history, allergies, family history, immunizations andmedications were updated in the EMR today and reviewed under the history and medication portions of their EMR.     ROS: Negative, with the exception of above mentioned in HPI   Objective:  BP 130/88 (BP Location: Right Arm, Patient Position: Sitting, Cuff Size: Large)   Pulse 68   Temp 98.6 F (37 C)   Resp 20   Ht 5\' 11"  (1.803 m)   Wt 218 lb 8 oz (99.1 kg)   SpO2 96%   BMI 30.47 kg/m  Body mass index is 30.47 kg/m. Gen: Afebrile. No acute distress. Nontoxic in appearance, well developed, well nourished.  HENT: AT. [place]. Bilateral TM visualized without erythema or bulging. MMM, no oral lesions. Bilateral nares with erythema and swelling. Throat without erythema or exudates. Mild PND and cough. No sinus pressure.  Eyes:Pupils Equal Round Reactive to light, Extraocular movements intact,  Conjunctiva without redness, discharge or icterus. Neck/lymp/endocrine: Supple,no lymphadenopathy CV: RRR  Chest: CTAB, no wheeze or crackles. Mild rhonchi. Good air movement, normal resp effort.  Skin: no rashes, purpura or petechiae.  Neuro:  Normal gait. PERLA. EOMi. Alert. Oriented x3  No exam data present No results found. No results found for this or any previous visit (from the past 24 hour(s)).  Assessment/Plan: Basir Niven is a 46 y.o. male present for OV for  Cough/Allergic rhinitis due to other allergic trigger, unspecified seasonality Mild intermittent asthma, unspecified whether complicated - Continue zyrtec and Singulair, refills provided today. - Start flonase (prescribed) and mucinex Dm (OTC).  - albuterol inhaler Q6PRN, Refilled today. - change air filters routinely, humidifier use.  - conisder atrovent nasal spray add on if not controlled on  changes made above.  - fluticasone (FLONASE) 50 MCG/ACT nasal spray; Place 2 sprays into both nostrils daily.  Dispense: 16 g; Refill: 6 - albuterol (PROVENTIL HFA;VENTOLIN HFA) 108 (90 Base) MCG/ACT inhaler; Inhale 2 puffs into the lungs every 6 (six) hours as needed for wheezing or shortness of breath.  Dispense: 1 Inhaler; Refill: 2 - PRN   Reviewed expectations re: course of current medical issues.  Discussed self-management of symptoms.  Outlined signs and symptoms indicating need for more acute intervention.  Patient verbalized understanding and all questions were answered.  Patient received an After-Visit Summary.    No orders of the defined types were placed in this encounter.    Note is dictated utilizing voice recognition software. Although note has been proof read prior to signing, occasional typographical errors still can be missed. If any questions arise, please do not hesitate to call for verification.   electronically signed by:  Felix Pacini, DO  [facility] [place]

## 2017-10-02 ENCOUNTER — Ambulatory Visit: Payer: Commercial Managed Care - PPO | Admitting: Family Medicine

## 2017-10-02 ENCOUNTER — Encounter: Payer: Self-pay | Admitting: Family Medicine

## 2017-10-02 VITALS — BP 110/74 | HR 77 | Temp 98.0°F | Wt 215.4 lb

## 2017-10-02 DIAGNOSIS — J301 Allergic rhinitis due to pollen: Secondary | ICD-10-CM | POA: Diagnosis not present

## 2017-10-02 DIAGNOSIS — M7071 Other bursitis of hip, right hip: Secondary | ICD-10-CM | POA: Diagnosis not present

## 2017-10-02 MED ORDER — NAPROXEN 500 MG PO TABS: 500.0000 mg | ORAL_TABLET | Freq: Two times a day (BID) | ORAL | 0 refills | Status: DC

## 2017-10-02 MED ORDER — AZELASTINE HCL 0.1 % NA SOLN: 2.0000 | Freq: Two times a day (BID) | NASAL | 12 refills | Status: DC

## 2017-10-02 NOTE — Patient Instructions (Signed)
Continue zyrtec, Singulair and flonase.  Start Astelin nasal spray 2 sprays, 2x a day.   If this works well we can try to get the combination spray of flonase and Astelin, but typically not covered.   Your hip discomfort sounds like bursitis. Start naproxen every 12 hours with food for 7 days. Avoid pressure or laying on right hip/side. If does not improve could be seen by sports med doctor.     Hip Bursitis Hip bursitis is inflammation of a fluid-filled sac (bursa) in the hip joint. The bursa protects the bones in the hip joint from rubbing against each other. Hip bursitis can cause mild to moderate pain, and symptoms often come and go over time. What are the causes? This condition may be caused by:  Injury to the hip.  Overuse of the muscles that surround the hip joint.  Arthritis or gout.  Diabetes.  Thyroid disease.  Cold weather.  Infection.  In some cases, the cause may not be known. What are the signs or symptoms? Symptoms of this condition may include:  Mild or moderate pain in the hip area. Pain may get worse with movement.  Tenderness and swelling of the hip, especially on the outer side of the hip.  Symptoms may come and go. If the bursa becomes infected, you may have the following symptoms:  Fever.  Red skin and a feeling of warmth in the hip area.  How is this diagnosed? This condition may be diagnosed based on:  A physical exam.  Your medical history.  X-rays.  Removal of fluid from your inflamed bursa for testing (biopsy).   How is this treated? This condition is treated by resting, raising (elevating), and applying pressure(compression) to the injured area. In some cases, this may be enough to make your symptoms go away. Treatment may also include:  Crutches.  Antibiotic medicine.  Draining fluid out of the bursa to help relieve swelling.  Injecting medicine that helps to reduce inflammation (cortisone).  Follow these instructions at  home: Medicines  Take over-the-counter and prescription medicines only as told by your health care provider.  Do not drive or operate heavy machinery while taking prescription pain medicine, or as told by your health care provider.  If you were prescribed an antibiotic, take it as told by your health care provider. Do not stop taking the antibiotic even if you start to feel better. Activity  Return to your normal activities as told by your health care provider. Ask your health care provider what activities are safe for you.  Rest and protect your hip as much as possible until your pain and swelling get better. General instructions  Wear compression wraps only as told by your health care provider.  Elevate your hip above the level of your heart as much as you can without pain. To do this, try putting a pillow under your hips while you lie down.  Do not use your hip to support your body weight until your health care provider says that you can. Use crutches as told by your health care provider.  Gently massage and stretch your injured area as often as is comfortable.  Keep all follow-up visits as told by your health care provider. This is important. How is this prevented?  Exercise regularly, as told by your health care provider.  Warm up and stretch before being active.  Cool down and stretch after being active.  If an activity irritates your hip or causes pain, avoid the activity as  much as possible.  Avoid sitting down for long periods at a time. Contact a health care provider if:  You have a fever.  You develop new symptoms.  You have difficulty walking or doing everyday activities.  You have pain that gets worse or does not get better with medicine.  You develop red skin or a feeling of warmth in your hip area. Get help right away if:  You cannot move your hip.  You have severe pain. This information is not intended to replace advice given to you by your health  care provider. Make sure you discuss any questions you have with your health care provider. Document Released: 03/23/2002 Document Revised: 03/08/2016 Document Reviewed: 05/03/2015 Elsevier Interactive Patient Education  [employer]2018 [employer]

## 2017-10-02 NOTE — Progress Notes (Signed)
Cody Bennett , 01/19/1971, 46 y.o., male MRN: [id number] Patient Care Team    Relationship Specialty Notifications Start End  Natalia LeatherwoodKuneff, Ayliana Casciano A, DO PCP - General Family Medicine  06/19/16     Chief Complaint  Patient presents with  . URI    pts complain of Meds Flonase not helpful     Subjective: Right hip pain:  Pt reports new complaint of right lateral hip discomfort. He denies any known injury. It has been present for a couple weeks. No activity or movement seems to make pain occur. The pain is intermittent, burning  in nature, does not radiate. He endorses discomfort with laying on his right side, which he routinely sleeps on his sides. He denies fever, redness, rash over area.   Allergic rhinitis:  He returns today to report he continues to have nasal drainage/PND. He feels the cough has greatly improved. He is now taking Flonase, singulair, zyrtec daily. He denies fever, chills, sore throat, headache or sinus pressure. Prior note:  Pt presents for an OV with complaints of cough of a 10 days duration.  Associated symptoms include cough, rhinorrhea, nasal congestion, chest tightness. He denies fever, chills, nausea or vomit. He has a h/o asthma but has not used his inhaler in years. Pt has tried routinely taking zyrtec and singulair to ease their symptoms.   [facility] 08/13/2017 07/20/2016 06/19/2016  Decreased Interest 0 0 0  Down, Depressed, Hopeless 0 0 0  PHQ - 2 Score 0 0 0    Allergies  Allergen Reactions  . Apple Swelling  . Peach [Prunus Persica] Swelling   Social History   Tobacco Use  . Smoking status: Never Smoker  . Smokeless tobacco: Never Used  Substance Use Topics  . Alcohol use: Yes    Alcohol/week: 3.6 oz    Types: 6 Glasses of wine per week   Past Medical History:  Diagnosis Date  . Allergy   . Asthma   . Tenosynovitis of left wrist 2018   Past Surgical History:  Procedure Laterality Date  . KNEE SURGERY Left 2010 and 2012    Family History  Problem Relation Age of Onset  . Hearing loss Mother   . Stroke Brother   . Arthritis Maternal Aunt   . Mental illness Maternal Aunt   . Arthritis Paternal Uncle   . Heart disease Paternal Uncle   . Asthma Maternal Grandmother   . Hearing loss Maternal Grandmother   . Mental illness Maternal Grandfather    Allergies as of 10/02/2017      Reactions   Apple Swelling   Peach [prunus Persica] Swelling      Medication List        Accurate as of 10/02/17  1:04 PM. Always use your most recent med list.          albuterol 108 (90 Base) MCG/ACT inhaler Commonly known as:  PROVENTIL HFA;VENTOLIN HFA Inhale 2 puffs into the lungs every 6 (six) hours as needed for wheezing or shortness of breath.   cetirizine 10 MG tablet Commonly known as:  ZYRTEC Take 1 tablet (10 mg total) by mouth daily as needed for allergies.   fluticasone 50 MCG/ACT nasal spray Commonly known as:  FLONASE Place 2 sprays into both nostrils daily.   montelukast 10 MG tablet Commonly known as:  SINGULAIR Take 1 tablet (10 mg total) by mouth at bedtime.       All past medical history, surgical history, allergies, family history,  immunizations andmedications were updated in the EMR today and reviewed under the history and medication portions of their EMR.     ROS: Negative, with the exception of above mentioned in HPI   Objective:  BP 110/74 (BP Location: Left Arm, Patient Position: Sitting, Cuff Size: Large)   Pulse 77   Temp 98 F (36.7 C) (Oral)   Wt 215 lb 6.4 oz (97.7 kg)   SpO2 97%   BMI 30.04 kg/m  Body mass index is 30.04 kg/m. Gen: Afebrile. No acute distress. Nontoxic in appearance, well-developed, well-nourished, Caucasian male. HENT: AT. [place]. Bilateral TM visualized and normal in appearance. MMM. Bilateral nares very mild erythema, drainage present. Throat without erythema or exudates. Mild postnasal drip present. No cough, no hoarseness, no sinus  pressure. Eyes:Pupils Equal Round Reactive to light, Extraocular movements intact,  Conjunctiva without redness, discharge or icterus. Neck/lymp/endocrine: Supple, no lymphadenopathy CV: RRR Chest: CTAB, no wheeze or crackles MSK: No erythema, no soft tissue swelling. No rash. No bruising. No pain to palpation on exam today. Full range of motion without discomfort. NV intact  distally. Neuro: Normal gait. PERLA. EOMi. Alert. Oriented x3 No exam data present No results found. No results found for this or any previous visit (from the past 24 hour(s)).  Assessment/Plan: Cody Bennett is a 46 y.o. male present for OV for  Allergic rhinitis due to other allergic trigger, unspecified seasonality - Continue Zyrtec, Flonase and Singulair. Mucinex when necessary. - Start Astelin nasal spray. It works well could try prescribed Flonase/Astelin combo. If does not work well could consider Atrovent added on. Discussed Atrovent with him today, and he wanted to wait on this medication and try the Astelin first. - change air filters routinely, humidifier use.  - Follow-up 4 weeks if not under better control  Right hip bursitis: Patient's exam and history consistent with mild right hip bursitis. Will treat conservatively with naproxen twice a day for 7 days. Patient was given AVS information on home therapy for bursitis.  - Avoid apply pressure to right hip. Icing can be helpful. - If not improved could consider referring him to sports med, which he is already established. - Follow-up 4 weeks if not improved  Reviewed expectations re: course of current medical issues.  Discussed self-management of symptoms.  Outlined signs and symptoms indicating need for more acute intervention.  Patient verbalized understanding and all questions were answered.  Patient received an After-Visit Summary.    No orders of the defined types were placed in this encounter.    Note is dictated utilizing voice  recognition software. Although note has been proof read prior to signing, occasional typographical errors still can be missed. If any questions arise, please do not hesitate to call for verification.   electronically signed by:  Felix Pacinienee Nitesh Pitstick, DO  [facility] - [place]

## 2018-02-27 IMAGING — DX DG CHEST 2V
2 series · 2 of 2 positions shown · non-contrast
Comparison: None.

CLINICAL DATA: Cough for 6 weeks.  Asthma.

EXAM:
CHEST  2 VIEW

[chest pa]
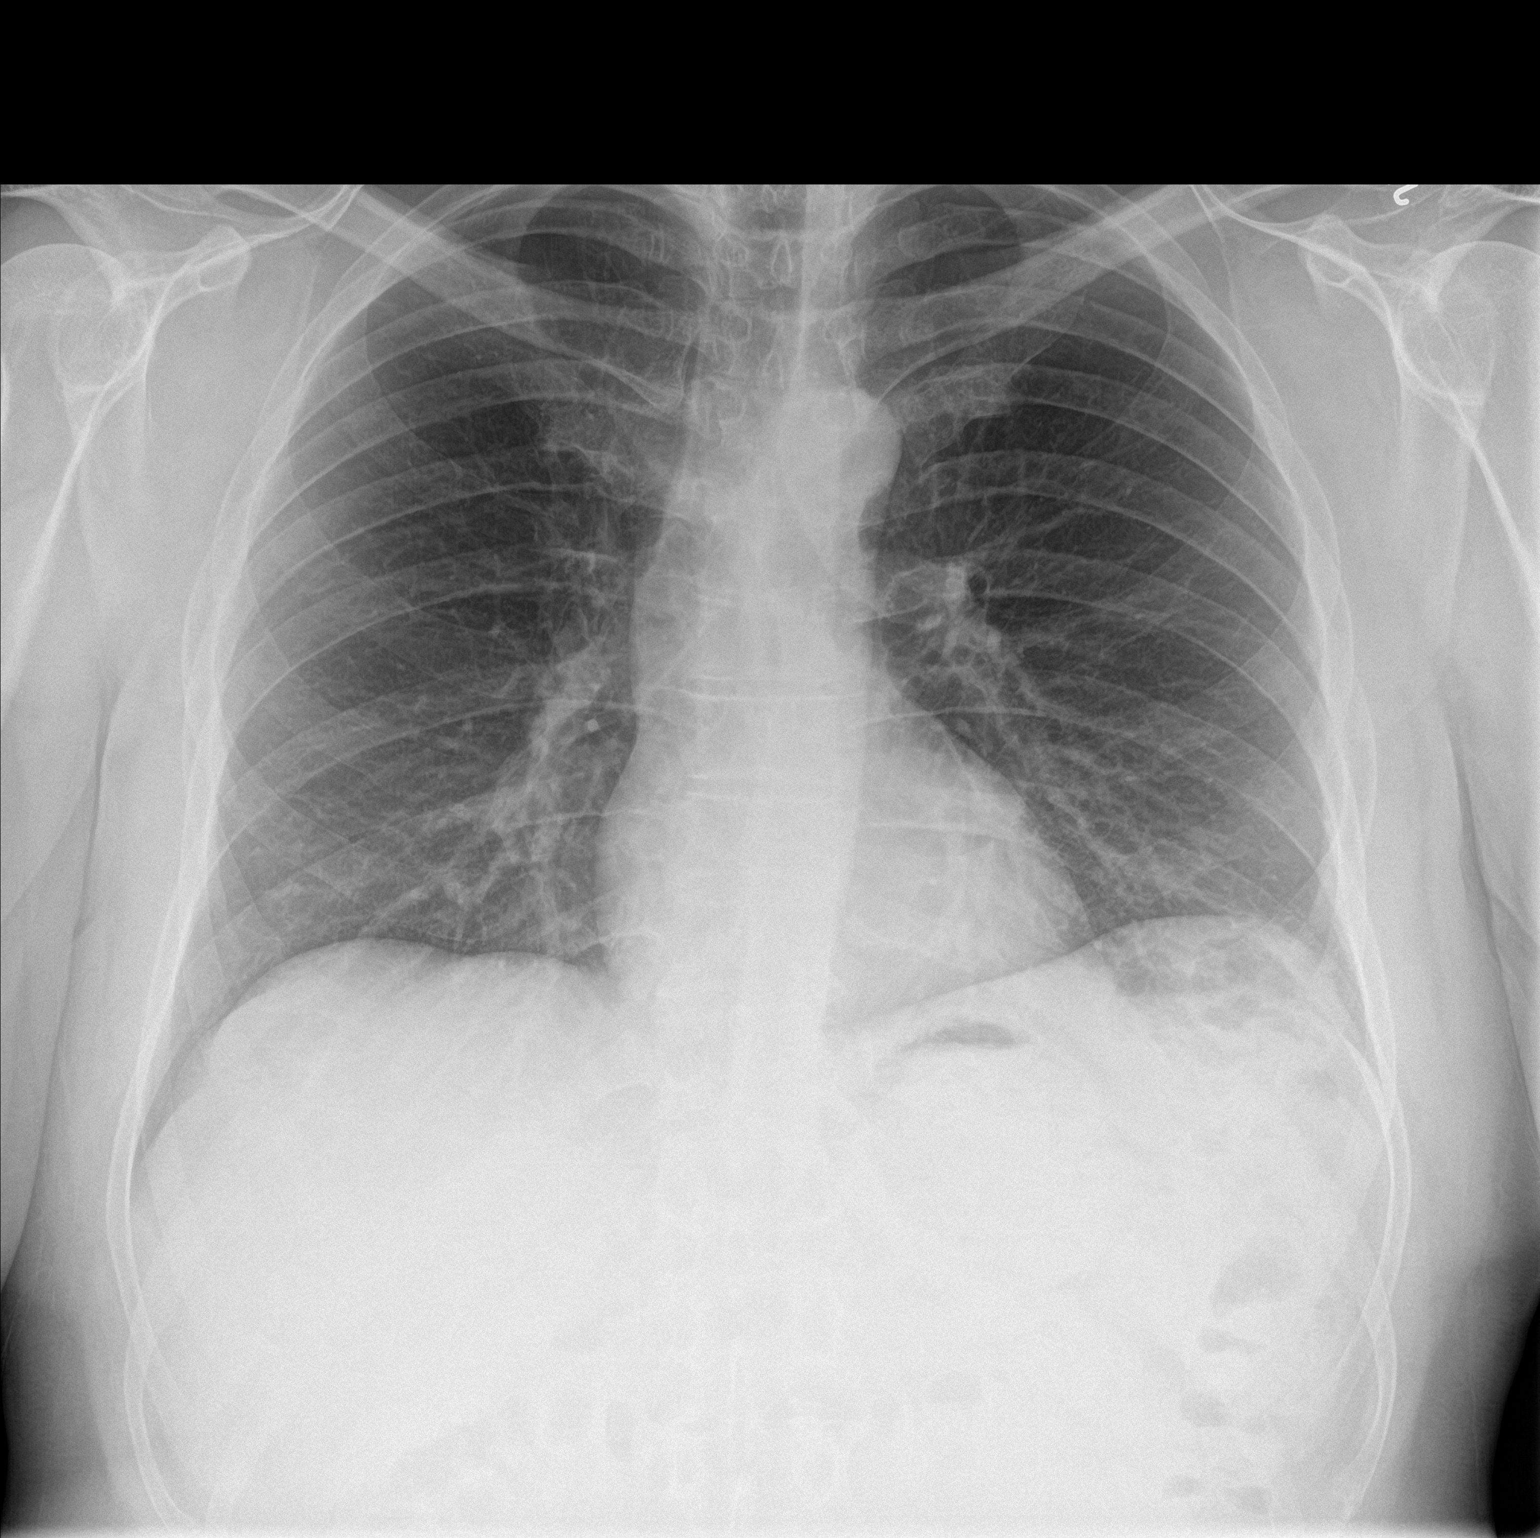

[chest lat]
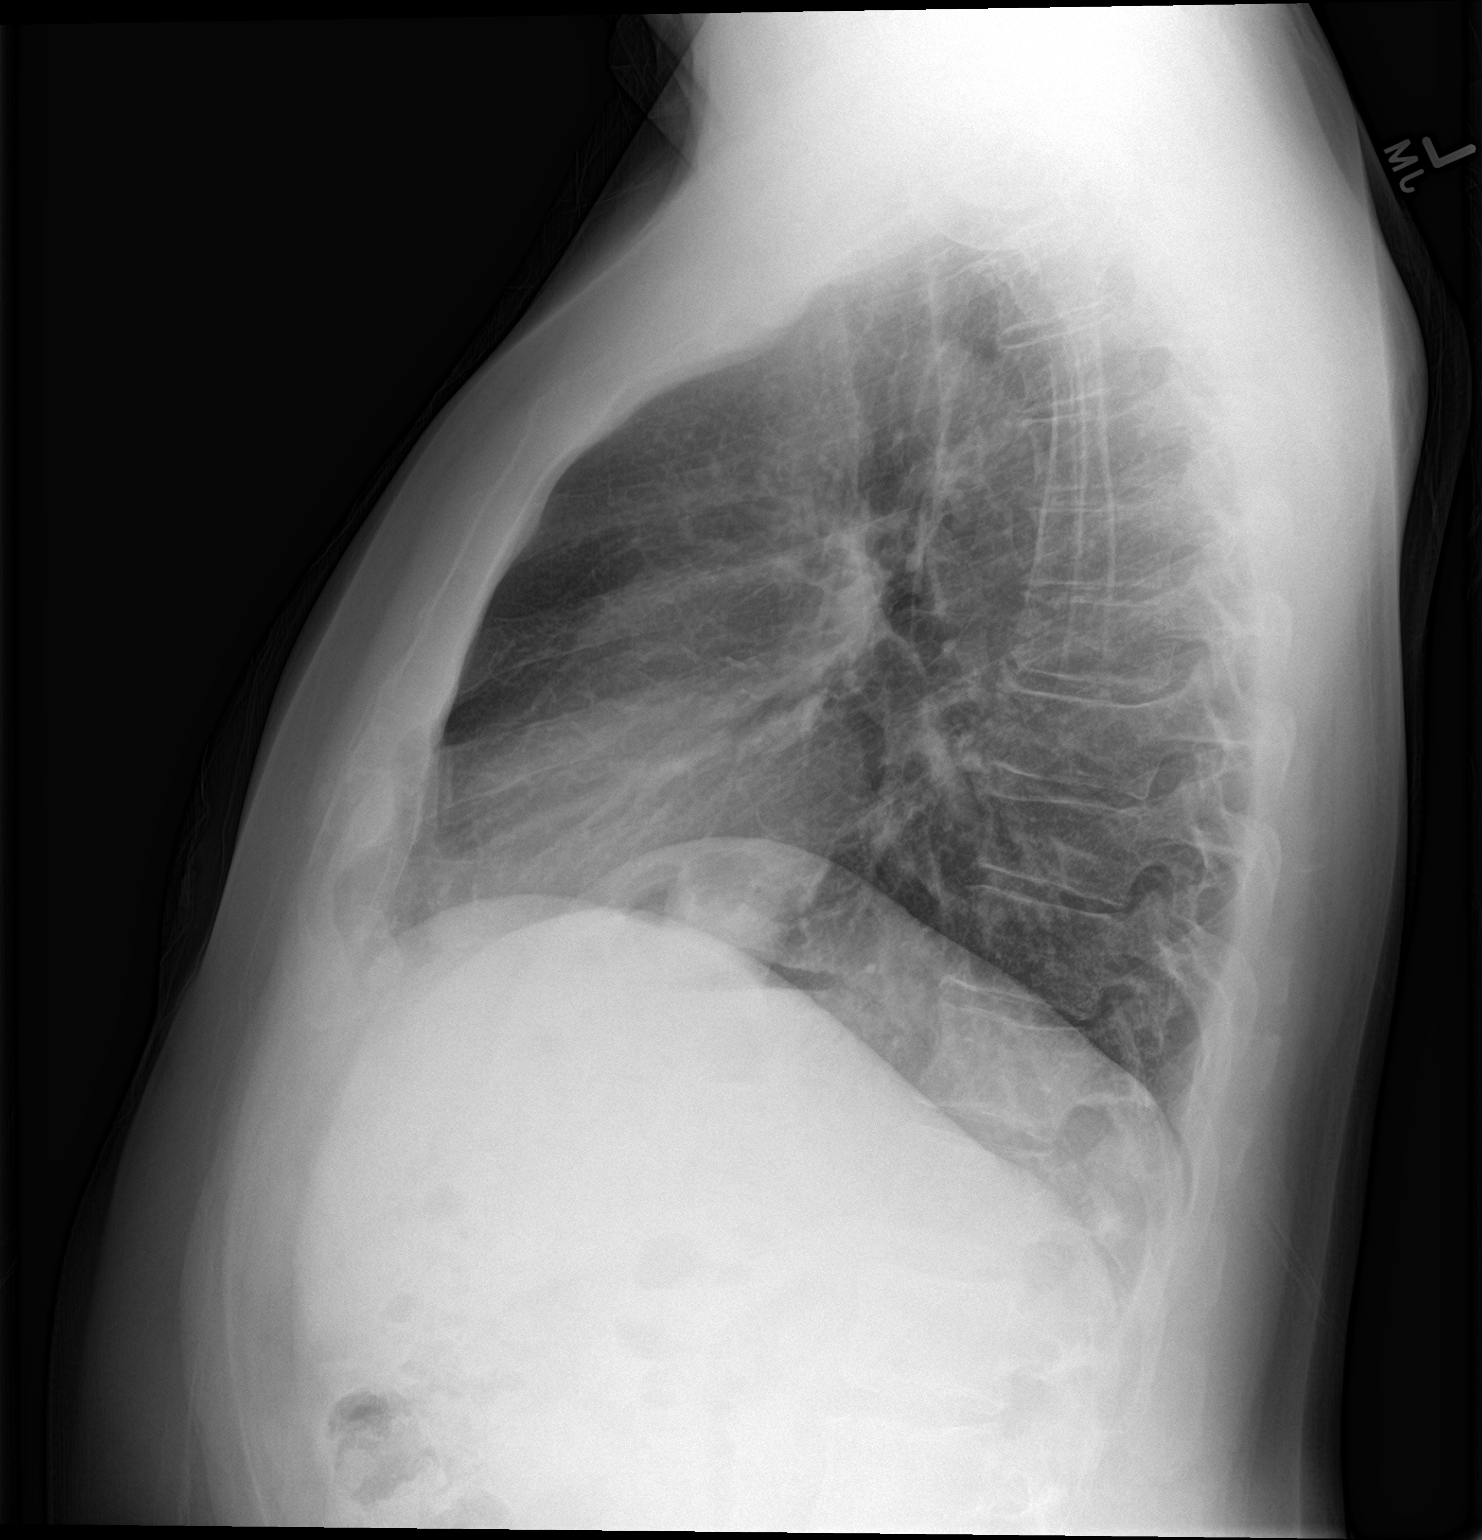

[2 of 2 positions shown; findings below may reference images not displayed]

FINDINGS: The heart size and mediastinal contours are within normal limits.
Both lungs are clear. The visualized skeletal structures are
unremarkable.
IMPRESSION: No active cardiopulmonary disease.

## 2018-09-02 ENCOUNTER — Ambulatory Visit (INDEPENDENT_AMBULATORY_CARE_PROVIDER_SITE_OTHER): Payer: Commercial Managed Care - PPO | Admitting: Family Medicine

## 2018-09-02 ENCOUNTER — Encounter: Payer: Self-pay | Admitting: Family Medicine

## 2018-09-02 VITALS — BP 135/85 | HR 73 | Temp 98.1°F | Resp 20 | Ht 71.0 in | Wt 216.0 lb

## 2018-09-02 DIAGNOSIS — E669 Obesity, unspecified: Secondary | ICD-10-CM | POA: Diagnosis not present

## 2018-09-02 DIAGNOSIS — Z23 Encounter for immunization: Secondary | ICD-10-CM

## 2018-09-02 DIAGNOSIS — Z131 Encounter for screening for diabetes mellitus: Secondary | ICD-10-CM

## 2018-09-02 DIAGNOSIS — Z79899 Other long term (current) drug therapy: Secondary | ICD-10-CM | POA: Diagnosis not present

## 2018-09-02 DIAGNOSIS — Z130 Encounter for screening for diseases of the blood and blood-forming organs and certain disorders involving the immune mechanism: Secondary | ICD-10-CM | POA: Diagnosis not present

## 2018-09-02 DIAGNOSIS — Z0000 Encounter for general adult medical examination without abnormal findings: Secondary | ICD-10-CM | POA: Diagnosis not present

## 2018-09-02 DIAGNOSIS — J3089 Other allergic rhinitis: Secondary | ICD-10-CM

## 2018-09-02 LAB — LIPID PANEL
CHOLESTEROL: 222 mg/dL — AB (ref 0–200)
HDL: 52.2 mg/dL (ref >39.00)
LDL Cholesterol: 145 mg/dL — ABNORMAL HIGH (ref 0–99)
NONHDL: 169.54
Total CHOL/HDL Ratio: 4
Triglycerides: 125 mg/dL (ref 0.0–149.0)
VLDL: 25 mg/dL (ref 0.0–40.0)

## 2018-09-02 LAB — CBC WITH DIFFERENTIAL/PLATELET
Basophils Absolute: 0 10*3/uL (ref 0.0–0.1)
Basophils Relative: 0.7 % (ref 0.0–3.0)
Eosinophils Absolute: 0.1 10*3/uL (ref 0.0–0.7)
Eosinophils Relative: 2.4 % (ref 0.0–5.0)
HCT: 46.1 % (ref 39.0–52.0)
Hemoglobin: 15.3 g/dL (ref 13.0–17.0)
Lymphocytes Relative: 31.1 % (ref 12.0–46.0)
Lymphs Abs: 1.8 10*3/uL (ref 0.7–4.0)
MCHC: 33.3 g/dL (ref 30.0–36.0)
MCV: 88.8 fl (ref 78.0–100.0)
Monocytes Absolute: 0.5 10*3/uL (ref 0.1–1.0)
Monocytes Relative: 8.7 % (ref 3.0–12.0)
Neutro Abs: 3.3 10*3/uL (ref 1.4–7.7)
Neutrophils Relative %: 57.1 % (ref 43.0–77.0)
Platelets: 241 10*3/uL (ref 150.0–400.0)
RBC: 5.19 Mil/uL (ref 4.22–5.81)
RDW: 13.9 % (ref 11.5–15.5)
WBC: 5.7 10*3/uL (ref 4.0–10.5)

## 2018-09-02 LAB — COMPREHENSIVE METABOLIC PANEL
ALT: 19 U/L (ref 0–53)
AST: 13 U/L (ref 0–37)
Albumin: 4.7 g/dL (ref 3.5–5.2)
Alkaline Phosphatase: 86 U/L (ref 39–117)
BUN: 15 mg/dL (ref 6–23)
CO2: 29 mEq/L (ref 19–32)
Calcium: 9.7 mg/dL (ref 8.4–10.5)
Chloride: 103 mEq/L (ref 96–112)
Creatinine, Ser: 0.96 mg/dL (ref 0.40–1.50)
GFR: 88.53 mL/min (ref >60.00)
Glucose, Bld: 100 mg/dL — ABNORMAL HIGH (ref 70–99)
Potassium: 4.4 mEq/L (ref 3.5–5.1)
Sodium: 140 mEq/L (ref 135–145)
Total Bilirubin: 1.1 mg/dL (ref 0.2–1.2)
Total Protein: 7 g/dL (ref 6.0–8.3)

## 2018-09-02 LAB — HEMOGLOBIN A1C: HEMOGLOBIN A1C: 5.5 % (ref 4.6–6.5)

## 2018-09-02 MED ORDER — CETIRIZINE HCL 10 MG PO TABS: 10.0000 mg | ORAL_TABLET | Freq: Every day | ORAL | 3 refills | Status: AC | PRN

## 2018-09-02 MED ORDER — MONTELUKAST SODIUM 10 MG PO TABS: 10.0000 mg | ORAL_TABLET | Freq: Every day | ORAL | 3 refills | Status: AC

## 2018-09-02 MED ORDER — FLUTICASONE PROPIONATE 50 MCG/ACT NA SUSP: 2.0000 | Freq: Every day | NASAL | 6 refills | Status: AC

## 2018-09-02 NOTE — Progress Notes (Signed)
Patient ID: Cody Bennett, male  DOB: [birth year], 47 y.o.   MRN: [id number] Patient Care Team    Relationship Specialty Notifications Start End  Ma Hillock, DO PCP - General Family Medicine  06/19/16     Chief Complaint  Patient presents with  . Annual Exam    Subjective:  Cody Bennett is a 47 y.o. male present for CPE. All past medical history, surgical history, allergies, family history, immunizations, medications and social history were updated in the electronic medical record today. All recent labs, ED visits and hospitalizations within the last year were reviewed.  Health maintenance: updated 09/02/18 Colonoscopy: no fhx. Screen 50 Immunizations: tdap 2012, Influenza 09/02/2018- provided today(encouraged yearly) Infectious disease screening: HIV 2012 Prostate cancer screen: low risk. Caucasian male, no changes in urinary stream. Screen around 49 Assistive device: none Oxygen XEN:MMHW Patient has a Dental home. Hospitalizations/ED visits: reviewed  Depression screen [facility] 2/9 09/02/2018 08/13/2017 07/20/2016 06/19/2016  Decreased Interest 0 0 0 0  Down, Depressed, Hopeless - 0 0 0  PHQ - 2 Score 0 0 0 0   No flowsheet data found.  Fall Risk  09/02/2018 07/20/2016 06/19/2016  Falls in the past year? 0 Yes Yes  Number falls in past yr: - 1 1  Injury with Fall? - Yes Yes  Comment - - shoulder injury  Follow up - Falls evaluation completed -    Immunization History  Administered Date(s) Administered  . Influenza,inj,Quad PF,6+ Mos 07/20/2016  . Tdap 05/16/2011    Past Medical History:  Diagnosis Date  . Allergy   . Asthma   . Tenosynovitis of left wrist 2018   Allergies  Allergen Reactions  . Apple Swelling  . Peach [Prunus Persica] Swelling   Past Surgical History:  Procedure Laterality Date  . KNEE SURGERY Left 2010 and 2012   Family History  Problem Relation Age of Onset  . Hearing loss Mother   . Stroke Brother   . Arthritis Maternal Aunt    . Mental illness Maternal Aunt   . Arthritis Paternal Uncle   . Heart disease Paternal Uncle   . Asthma Maternal Grandmother   . Hearing loss Maternal Grandmother   . Mental illness Maternal Grandfather    Social History   Socioeconomic History  . Marital status: Married    Spouse name: Not on file  . Number of children: Not on file  . Years of education: Not on file  . Highest education level: Not on file  Occupational History  . Not on file  Social Needs  . Financial resource strain: Not on file  . Food insecurity:    Worry: Not on file    Inability: Not on file  . Transportation needs:    Medical: Not on file    Non-medical: Not on file  Tobacco Use  . Smoking status: Never Smoker  . Smokeless tobacco: Never Used  Substance and Sexual Activity  . Alcohol use: Yes    Alcohol/week: 6.0 standard drinks    Types: 6 Glasses of wine per week  . Drug use: No  . Sexual activity: Yes    Partners: Female    Birth control/protection: Condom    Comment: married  Lifestyle  . Physical activity:    Days per week: Not on file    Minutes per session: Not on file  . Stress: Not on file  Relationships  . Social connections:    Talks on phone: Not on file  Gets together: Not on file    Attends religious service: Not on file    Active member of club or organization: Not on file    Attends meetings of clubs or organizations: Not on file    Relationship status: Not on file  . Intimate partner violence:    Fear of current or ex partner: Not on file    Emotionally abused: Not on file    Physically abused: Not on file    Forced sexual activity: Not on file  Other Topics Concern  . 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    Master's degree. VP/Finance.    Drinks caffeine.   Wears seatbelt, bicycle helmet   Exercises routinely.    Smoke detector in the home.    Feels safe in relationships.    Allergies as of 09/02/2018      Reactions    Apple Swelling   Peach [prunus Persica] Swelling      Medication List        Accurate as of 09/02/18  1:57 PM. Always use your most recent med list.          albuterol 108 (90 Base) MCG/ACT inhaler Commonly known as:  PROVENTIL HFA;VENTOLIN HFA Inhale 2 puffs into the lungs every 6 (six) hours as needed for wheezing or shortness of breath.   cetirizine 10 MG tablet Commonly known as:  ZYRTEC Take 1 tablet (10 mg total) by mouth daily as needed for allergies.   fluticasone 50 MCG/ACT nasal spray Commonly known as:  FLONASE Place 2 sprays into both nostrils daily.   montelukast 10 MG tablet Commonly known as:  SINGULAIR Take 1 tablet (10 mg total) by mouth at bedtime.      All past medical history, surgical history, allergies, family history, immunizations andmedications were updated in the EMR today and reviewed under the history and medication portions of their EMR.     No results found for this or any previous visit (from the past 2160 hour(s)).    ROS: 14 pt review of systems performed and negative (unless mentioned in an HPI)  Objective: BP 135/85 (BP Location: Right Arm, Patient Position: Sitting, Cuff Size: Normal)   Pulse 73   Temp 98.1 F (36.7 C)   Resp 20   Ht '5\' 11"'$  (1.803 m)   Wt 216 lb (98 kg)   SpO2 98%   BMI 30.13 kg/m  Gen: Afebrile. No acute distress. Nontoxic in appearance, well-developed, well-nourished, pleasant caucasian male.  HENT: AT. [place]. Bilateral TM visualized and normal in appearance, normal external auditory canal. MMM, no oral lesions, adequate dentition. Bilateral nares within normal limits. Throat without erythema, ulcerations or exudates. no Cough on exam, no hoarseness on exam. Eyes:Pupils Equal Round Reactive to light, Extraocular movements intact,  Conjunctiva without redness, discharge or icterus. Neck/lymp/endocrine: Supple,no lymphadenopathy, no thyromegaly CV: RRR no murmur, no edema, +2/4 P posterior tibialis pulses. no  carotid bruits. No JVD. Chest: CTAB, no wheeze, rhonchi or crackles. normal Respiratory effort. good Air movement. Abd: Soft. flat. NTND. BS present. no Masses palpated. No hepatosplenomegaly. No rebound tenderness or guarding. Skin: no rashes, purpura or petechiae. Warm and well-perfused. Skin intact. Neuro/Msk:  Normal gait. PERLA. EOMi. Alert. Oriented x3.  Cranial nerves II through XII intact. Muscle strength 5/5 upper/lower extremity. DTRs equal bilaterally. Psych: Normal affect, dress and demeanor. Normal speech. Normal thought content and judgment.  No exam data present  Assessment/plan: Reilly Blades is a 47  y.o. male present for CPE Screening for deficiency anemia - CBC w/Diff Lipid screening/Obesity (BMI 30-39.9) - Lipid panel Diabetes mellitus screening - HgB A1c Encounter for long-term current use of medication - Comp Met (CMET) Allergic rhinitis due to other allergic trigger, unspecified seasonality - stable, refills on zyrtec, Singulair and flonase - fluticasone (FLONASE) 50 MCG/ACT nasal spray; Place 2 sprays into both nostrils daily.  Dispense: 16 g; Refill: 6 Encounter for preventive health examination Patient was encouraged to exercise greater than 150 minutes a week. Patient was encouraged to choose a diet filled with fresh fruits and vegetables, and lean meats. AVS provided to patient today for education/recommendation on gender specific health and safety maintenance. Colonoscopy: no fhx. Screen 50 Immunizations: tdap 2012, Influenza 09/02/2018- provided today(encouraged yearly) Infectious disease screening: HIV 2012 DEXA: N/A Prostate cancer screen: low risk. Caucasian male, no changes in urinary stream. Screen around 50  Return in about 1 year (around 09/03/2019) for CPE.  Note is dictated utilizing voice recognition software. Although note has been proof read prior to signing, occasional typographical errors still can be missed. If any questions arise,  please do not hesitate to call for verification.  Electronically signed by: Howard Pouch, DO [place]

## 2018-09-02 NOTE — Patient Instructions (Signed)

## 2018-09-03 ENCOUNTER — Telehealth: Payer: Self-pay | Admitting: Family Medicine

## 2018-09-03 NOTE — Telephone Encounter (Signed)
Copied from CRM [phone]#[id number]. Topic: Quick Communication - Lab Results (Clinic Use ONLY) >> Sep 03, 2018  1:38 PM Rosalia Hammersay, Doreene NestLaura K, CMA wrote: Message left on voice mail for patient to return call. Okay for pec nurse to give results and recommendations. See note.   Pt calling back for lab results

## 2018-09-04 ENCOUNTER — Encounter: Payer: Self-pay | Admitting: Family Medicine

## 2018-09-04 NOTE — Telephone Encounter (Signed)
Charted in result notes. 

## 2018-10-22 DIAGNOSIS — M19012 Primary osteoarthritis, left shoulder: Secondary | ICD-10-CM | POA: Diagnosis not present

## 2018-10-22 DIAGNOSIS — Z4789 Encounter for other orthopedic aftercare: Secondary | ICD-10-CM | POA: Diagnosis not present

## 2018-10-22 DIAGNOSIS — M25512 Pain in left shoulder: Secondary | ICD-10-CM | POA: Diagnosis not present

## 2018-10-24 DIAGNOSIS — M19012 Primary osteoarthritis, left shoulder: Secondary | ICD-10-CM | POA: Diagnosis not present

## 2018-10-24 DIAGNOSIS — Z4789 Encounter for other orthopedic aftercare: Secondary | ICD-10-CM | POA: Diagnosis not present

## 2018-10-24 DIAGNOSIS — M25512 Pain in left shoulder: Secondary | ICD-10-CM | POA: Diagnosis not present

## 2018-10-27 DIAGNOSIS — Z4789 Encounter for other orthopedic aftercare: Secondary | ICD-10-CM | POA: Diagnosis not present

## 2018-10-27 DIAGNOSIS — M25512 Pain in left shoulder: Secondary | ICD-10-CM | POA: Diagnosis not present

## 2018-10-27 DIAGNOSIS — M19012 Primary osteoarthritis, left shoulder: Secondary | ICD-10-CM | POA: Diagnosis not present

## 2018-10-30 DIAGNOSIS — M19012 Primary osteoarthritis, left shoulder: Secondary | ICD-10-CM | POA: Diagnosis not present

## 2018-10-30 DIAGNOSIS — M25512 Pain in left shoulder: Secondary | ICD-10-CM | POA: Diagnosis not present

## 2018-10-30 DIAGNOSIS — Z4789 Encounter for other orthopedic aftercare: Secondary | ICD-10-CM | POA: Diagnosis not present

## 2018-10-31 DIAGNOSIS — M19012 Primary osteoarthritis, left shoulder: Secondary | ICD-10-CM | POA: Diagnosis not present

## 2018-10-31 DIAGNOSIS — Z4789 Encounter for other orthopedic aftercare: Secondary | ICD-10-CM | POA: Diagnosis not present

## 2018-10-31 DIAGNOSIS — M25512 Pain in left shoulder: Secondary | ICD-10-CM | POA: Diagnosis not present

## 2018-11-03 DIAGNOSIS — Z4789 Encounter for other orthopedic aftercare: Secondary | ICD-10-CM | POA: Diagnosis not present

## 2018-11-03 DIAGNOSIS — M19012 Primary osteoarthritis, left shoulder: Secondary | ICD-10-CM | POA: Diagnosis not present

## 2018-11-03 DIAGNOSIS — M25512 Pain in left shoulder: Secondary | ICD-10-CM | POA: Diagnosis not present

## 2018-11-05 DIAGNOSIS — M19012 Primary osteoarthritis, left shoulder: Secondary | ICD-10-CM | POA: Diagnosis not present

## 2018-11-05 DIAGNOSIS — Z4789 Encounter for other orthopedic aftercare: Secondary | ICD-10-CM | POA: Diagnosis not present

## 2018-11-05 DIAGNOSIS — M25512 Pain in left shoulder: Secondary | ICD-10-CM | POA: Diagnosis not present

## 2018-11-06 DIAGNOSIS — M25512 Pain in left shoulder: Secondary | ICD-10-CM | POA: Diagnosis not present

## 2018-11-06 DIAGNOSIS — M19012 Primary osteoarthritis, left shoulder: Secondary | ICD-10-CM | POA: Diagnosis not present

## 2018-11-06 DIAGNOSIS — Z4789 Encounter for other orthopedic aftercare: Secondary | ICD-10-CM | POA: Diagnosis not present

## 2018-11-10 DIAGNOSIS — M19012 Primary osteoarthritis, left shoulder: Secondary | ICD-10-CM | POA: Diagnosis not present

## 2018-11-10 DIAGNOSIS — M25512 Pain in left shoulder: Secondary | ICD-10-CM | POA: Diagnosis not present

## 2018-11-10 DIAGNOSIS — Z4789 Encounter for other orthopedic aftercare: Secondary | ICD-10-CM | POA: Diagnosis not present

## 2018-11-11 DIAGNOSIS — M85812 Other specified disorders of bone density and structure, left shoulder: Secondary | ICD-10-CM | POA: Diagnosis not present

## 2018-11-11 DIAGNOSIS — M8580 Other specified disorders of bone density and structure, unspecified site: Secondary | ICD-10-CM | POA: Diagnosis not present

## 2018-11-11 DIAGNOSIS — M19012 Primary osteoarthritis, left shoulder: Secondary | ICD-10-CM | POA: Diagnosis not present

## 2018-11-11 DIAGNOSIS — S42202K Unspecified fracture of upper end of left humerus, subsequent encounter for fracture with nonunion: Secondary | ICD-10-CM | POA: Diagnosis not present

## 2018-11-12 DIAGNOSIS — Z4789 Encounter for other orthopedic aftercare: Secondary | ICD-10-CM | POA: Diagnosis not present

## 2018-11-12 DIAGNOSIS — M19012 Primary osteoarthritis, left shoulder: Secondary | ICD-10-CM | POA: Diagnosis not present

## 2018-11-12 DIAGNOSIS — M25512 Pain in left shoulder: Secondary | ICD-10-CM | POA: Diagnosis not present

## 2018-11-14 DIAGNOSIS — Z4789 Encounter for other orthopedic aftercare: Secondary | ICD-10-CM | POA: Diagnosis not present

## 2018-11-14 DIAGNOSIS — M25512 Pain in left shoulder: Secondary | ICD-10-CM | POA: Diagnosis not present

## 2018-11-14 DIAGNOSIS — M19012 Primary osteoarthritis, left shoulder: Secondary | ICD-10-CM | POA: Diagnosis not present

## 2018-11-17 DIAGNOSIS — Z4789 Encounter for other orthopedic aftercare: Secondary | ICD-10-CM | POA: Diagnosis not present

## 2018-11-17 DIAGNOSIS — M25512 Pain in left shoulder: Secondary | ICD-10-CM | POA: Diagnosis not present

## 2018-11-17 DIAGNOSIS — M19012 Primary osteoarthritis, left shoulder: Secondary | ICD-10-CM | POA: Diagnosis not present

## 2018-11-24 DIAGNOSIS — M25512 Pain in left shoulder: Secondary | ICD-10-CM | POA: Diagnosis not present

## 2018-11-24 DIAGNOSIS — Z4789 Encounter for other orthopedic aftercare: Secondary | ICD-10-CM | POA: Diagnosis not present

## 2018-11-24 DIAGNOSIS — M19012 Primary osteoarthritis, left shoulder: Secondary | ICD-10-CM | POA: Diagnosis not present

## 2018-11-25 DIAGNOSIS — M50322 Other cervical disc degeneration at C5-C6 level: Secondary | ICD-10-CM | POA: Diagnosis not present

## 2018-11-25 DIAGNOSIS — M50321 Other cervical disc degeneration at C4-C5 level: Secondary | ICD-10-CM | POA: Diagnosis not present

## 2018-11-25 DIAGNOSIS — M9901 Segmental and somatic dysfunction of cervical region: Secondary | ICD-10-CM | POA: Diagnosis not present

## 2018-11-26 DIAGNOSIS — M19012 Primary osteoarthritis, left shoulder: Secondary | ICD-10-CM | POA: Diagnosis not present

## 2018-11-26 DIAGNOSIS — M25512 Pain in left shoulder: Secondary | ICD-10-CM | POA: Diagnosis not present

## 2018-11-26 DIAGNOSIS — Z4789 Encounter for other orthopedic aftercare: Secondary | ICD-10-CM | POA: Diagnosis not present

## 2018-12-03 DIAGNOSIS — M19012 Primary osteoarthritis, left shoulder: Secondary | ICD-10-CM | POA: Diagnosis not present

## 2018-12-03 DIAGNOSIS — Z4789 Encounter for other orthopedic aftercare: Secondary | ICD-10-CM | POA: Diagnosis not present

## 2018-12-03 DIAGNOSIS — M25512 Pain in left shoulder: Secondary | ICD-10-CM | POA: Diagnosis not present

## 2018-12-08 DIAGNOSIS — Z4789 Encounter for other orthopedic aftercare: Secondary | ICD-10-CM | POA: Diagnosis not present

## 2018-12-08 DIAGNOSIS — M25512 Pain in left shoulder: Secondary | ICD-10-CM | POA: Diagnosis not present

## 2018-12-08 DIAGNOSIS — M19012 Primary osteoarthritis, left shoulder: Secondary | ICD-10-CM | POA: Diagnosis not present

## 2018-12-09 DIAGNOSIS — M50322 Other cervical disc degeneration at C5-C6 level: Secondary | ICD-10-CM | POA: Diagnosis not present

## 2018-12-09 DIAGNOSIS — M9901 Segmental and somatic dysfunction of cervical region: Secondary | ICD-10-CM | POA: Diagnosis not present

## 2018-12-09 DIAGNOSIS — M50321 Other cervical disc degeneration at C4-C5 level: Secondary | ICD-10-CM | POA: Diagnosis not present

## 2018-12-10 DIAGNOSIS — Z4789 Encounter for other orthopedic aftercare: Secondary | ICD-10-CM | POA: Diagnosis not present

## 2018-12-10 DIAGNOSIS — M25512 Pain in left shoulder: Secondary | ICD-10-CM | POA: Diagnosis not present

## 2018-12-10 DIAGNOSIS — M19012 Primary osteoarthritis, left shoulder: Secondary | ICD-10-CM | POA: Diagnosis not present

## 2018-12-12 DIAGNOSIS — M25512 Pain in left shoulder: Secondary | ICD-10-CM | POA: Diagnosis not present

## 2018-12-12 DIAGNOSIS — M19012 Primary osteoarthritis, left shoulder: Secondary | ICD-10-CM | POA: Diagnosis not present

## 2018-12-12 DIAGNOSIS — Z4789 Encounter for other orthopedic aftercare: Secondary | ICD-10-CM | POA: Diagnosis not present

## 2018-12-15 DIAGNOSIS — M25512 Pain in left shoulder: Secondary | ICD-10-CM | POA: Diagnosis not present

## 2018-12-15 DIAGNOSIS — Z4789 Encounter for other orthopedic aftercare: Secondary | ICD-10-CM | POA: Diagnosis not present

## 2018-12-15 DIAGNOSIS — M19012 Primary osteoarthritis, left shoulder: Secondary | ICD-10-CM | POA: Diagnosis not present

## 2018-12-17 DIAGNOSIS — Z4789 Encounter for other orthopedic aftercare: Secondary | ICD-10-CM | POA: Diagnosis not present

## 2018-12-17 DIAGNOSIS — M19012 Primary osteoarthritis, left shoulder: Secondary | ICD-10-CM | POA: Diagnosis not present

## 2018-12-17 DIAGNOSIS — M25512 Pain in left shoulder: Secondary | ICD-10-CM | POA: Diagnosis not present

## 2018-12-19 DIAGNOSIS — M19012 Primary osteoarthritis, left shoulder: Secondary | ICD-10-CM | POA: Diagnosis not present

## 2018-12-19 DIAGNOSIS — M25512 Pain in left shoulder: Secondary | ICD-10-CM | POA: Diagnosis not present

## 2018-12-19 DIAGNOSIS — Z4789 Encounter for other orthopedic aftercare: Secondary | ICD-10-CM | POA: Diagnosis not present

## 2018-12-22 DIAGNOSIS — Z4789 Encounter for other orthopedic aftercare: Secondary | ICD-10-CM | POA: Diagnosis not present

## 2018-12-22 DIAGNOSIS — M19012 Primary osteoarthritis, left shoulder: Secondary | ICD-10-CM | POA: Diagnosis not present

## 2018-12-22 DIAGNOSIS — M25512 Pain in left shoulder: Secondary | ICD-10-CM | POA: Diagnosis not present

## 2019-01-07 DIAGNOSIS — Z4789 Encounter for other orthopedic aftercare: Secondary | ICD-10-CM | POA: Diagnosis not present

## 2019-01-07 DIAGNOSIS — M25512 Pain in left shoulder: Secondary | ICD-10-CM | POA: Diagnosis not present

## 2019-01-07 DIAGNOSIS — M19012 Primary osteoarthritis, left shoulder: Secondary | ICD-10-CM | POA: Diagnosis not present

## 2019-01-08 DIAGNOSIS — R05 Cough: Secondary | ICD-10-CM | POA: Diagnosis not present

## 2019-01-09 ENCOUNTER — Ambulatory Visit (INDEPENDENT_AMBULATORY_CARE_PROVIDER_SITE_OTHER): Payer: Commercial Managed Care - PPO | Admitting: Family Medicine

## 2019-01-09 ENCOUNTER — Encounter: Payer: Self-pay | Admitting: Family Medicine

## 2019-01-09 DIAGNOSIS — R05 Cough: Secondary | ICD-10-CM | POA: Diagnosis not present

## 2019-01-09 DIAGNOSIS — J209 Acute bronchitis, unspecified: Secondary | ICD-10-CM | POA: Diagnosis not present

## 2019-01-09 DIAGNOSIS — R059 Cough, unspecified: Secondary | ICD-10-CM

## 2019-01-09 MED ORDER — AZITHROMYCIN 250 MG PO TABS: ORAL_TABLET | ORAL | 0 refills | Status: DC

## 2019-01-09 MED ORDER — BENZONATATE 200 MG PO CAPS: 200.0000 mg | ORAL_CAPSULE | Freq: Three times a day (TID) | ORAL | 0 refills | Status: DC | PRN

## 2019-01-09 NOTE — Patient Instructions (Signed)
Telehealth

## 2019-01-09 NOTE — Progress Notes (Signed)
   Virtual Visit via Video   I connected with@ on 01/09/19 at  2:00 PM EDT by a video enabled telemedicine application and verified that I am speaking with the correct person using two identifiers. Location patient: Home Location provider: [facility], Office Persons participating in the virtual visit: Pt and provider.   I discussed the limitations of evaluation and management by telemedicine and the availability of in person appointments. The patient expressed understanding and agreed to proceed.  Subjective:   Chief Complaint  Patient presents with  . Cough    x3 weeks, dry cough, no fever, Denies nasal congestion     HPI:  Patient complains of a dry cough of 2-3 weeks.  He denies any other symptoms.  He denies fever, chills, nausea, vomit, rash, nasal congestion or shortness of breath.  He reports he had a chart visit and was started on prednisone 50 mg daily which she started yesterday.  He reports compliance with daily Singulair, Flonase, Zyrtec for his allergy symptoms.  He has tried to use his albuterol inhaler to see if it would be helpful when he did not see much of a difference.  He has had some travel greater than 14 days ago to [place], he states that in [place] they monitored everyone very closely checking for symptoms and fevers, tracking whereabouts.  Had a cough prior to going to [place].  Since that time he is been home working out of his home office without exposures.  ROS: See pertinent positives and negatives per HPI.  Patient Active Problem List   Diagnosis Date Noted  . Obesity (BMI 30-39.9) 09/02/2018  . Encounter for long-term current use of medication 09/02/2018  . Allergic rhinitis due to pollen 10/02/2017  . Bursitis of right hip 10/02/2017  . Encounter for preventive health examination 07/20/2016    Social History   Tobacco Use  . Smoking status: Never Smoker  . Smokeless tobacco: Never Used  Substance Use Topics  . Alcohol use: Yes    Alcohol/week:  6.0 standard drinks    Types: 6 Glasses of wine per week    Current Outpatient Medications:  .  albuterol (PROVENTIL HFA;VENTOLIN HFA) 108 (90 Base) MCG/ACT inhaler, Inhale 2 puffs into the lungs every 6 (six) hours as needed for wheezing or shortness of breath., Disp: 1 Inhaler, Rfl: 2 .  cetirizine (ZYRTEC) 10 MG tablet, Take 1 tablet (10 mg total) by mouth daily as needed for allergies., Disp: 90 tablet, Rfl: 3 .  fluticasone (FLONASE) 50 MCG/ACT nasal spray, Place 2 sprays into both nostrils daily., Disp: 16 g, Rfl: 6 .  montelukast (SINGULAIR) 10 MG tablet, Take 1 tablet (10 mg total) by mouth at bedtime., Disp: 90 tablet, Rfl: 3  Allergies  Allergen Reactions  . Apple Swelling  . Peach [Prunus Persica] Swelling    Objective:  GEN: No acute distress. Per pt afebrile. Nontoxic in appearance.  HENT: AT. [place].  MMM.  Eyes:  Conjunctiva without redness, discharge or icterus. Chest: cough present during visit.   Neuro:  Alert. Oriented x3   Assessment and Plan:  Cough/acute bronchitis lasting greater than 10 days Rest, hydrate.  Continue Singulair, Zyrtec, Flonase Start over-the-counter Mucinex, which he states he has at home. Continue/complete prednisone prescribed by another provider. Added Z-Pak and Tessalon Perles-prescribed, take until completed.  If cough present it can last up to 6-8 weeks.  F/U 2 weeks of not improved.    Felix Pacini, DO 01/09/2019

## 2019-01-13 DIAGNOSIS — M9901 Segmental and somatic dysfunction of cervical region: Secondary | ICD-10-CM | POA: Diagnosis not present

## 2019-01-13 DIAGNOSIS — M50321 Other cervical disc degeneration at C4-C5 level: Secondary | ICD-10-CM | POA: Diagnosis not present

## 2019-01-13 DIAGNOSIS — M50322 Other cervical disc degeneration at C5-C6 level: Secondary | ICD-10-CM | POA: Diagnosis not present

## 2019-01-20 DIAGNOSIS — M87022 Idiopathic aseptic necrosis of left humerus: Secondary | ICD-10-CM | POA: Diagnosis not present

## 2019-01-22 ENCOUNTER — Ambulatory Visit (INDEPENDENT_AMBULATORY_CARE_PROVIDER_SITE_OTHER): Payer: Commercial Managed Care - PPO | Admitting: Family Medicine

## 2019-01-22 ENCOUNTER — Telehealth: Payer: Self-pay

## 2019-01-22 ENCOUNTER — Encounter: Payer: Self-pay | Admitting: Family Medicine

## 2019-01-22 ENCOUNTER — Other Ambulatory Visit: Payer: Self-pay

## 2019-01-22 VITALS — Wt 209.6 lb

## 2019-01-22 DIAGNOSIS — J301 Allergic rhinitis due to pollen: Secondary | ICD-10-CM | POA: Diagnosis not present

## 2019-01-22 DIAGNOSIS — J45991 Cough variant asthma: Secondary | ICD-10-CM

## 2019-01-22 DIAGNOSIS — R05 Cough: Secondary | ICD-10-CM

## 2019-01-22 DIAGNOSIS — R058 Other specified cough: Secondary | ICD-10-CM

## 2019-01-22 MED ORDER — PREDNISONE 10 MG PO TABS: ORAL_TABLET | ORAL | 0 refills | Status: AC

## 2019-01-22 MED ORDER — PANTOPRAZOLE SODIUM 40 MG PO TBEC: DELAYED_RELEASE_TABLET | ORAL | 0 refills | Status: AC

## 2019-01-22 NOTE — Telephone Encounter (Signed)
LM for pt to call back for appt @ 2 with Dr.McGowen.

## 2019-01-22 NOTE — Progress Notes (Signed)
Virtual Visit via Video Note  I connected with pt on 01/22/19 at  2:00 PM EDT by a video enabled telemedicine application and verified that I am speaking with the correct person using two identifiers.  Location patient: home Location provider:work or home office Persons participating in the virtual visit: patient, myself.  I discussed the limitations of evaluation and management by telemedicine and the availability of in person appointments. The patient expressed understanding and agreed to proceed.   HPI: 48 y/o male pt of Dr. Claiborne Billings, being seen today for 2 week f/u dry cough. He had just been started on prednisone by another provider when Dr. Claiborne Billings saw him.  She recommended he continue his flonase, zyrtec, and singulair and to finish prednisone.  She added a Z pack at that time and rx'd tessalon pearls. Was told to f/u 2 wks if not improved. In review of med list, it appears albuterol HFA was on his med list at the time of the visit 2 wks ago.  Interim hx: He has had this dry cough for about 1 mo. He got a little better after last visit but it didn't go away completely. A little chest tightness but not wheezing.  No fever.  Some postnasal drip beginning more lately.  No SOB. Took prednisone 50mg  qd x 5d per his report today. The tessalon didn't help.  Used albut inhaler up until 10d ago, said it didn't help. He feels occasional GER/indigestion.  Taking cough drops occasionally. No peppermint or spearmint. Has not significant cough at night.  No new meds other than that rx'd last 2 visits. Has had same cat for last 6 yrs.  No new animal exposures. He has been working from home for the last 3-4 wks.    ROS: See pertinent positives and negatives per HPI.  Past Medical History:  Diagnosis Date  . Allergy   . Asthma   . Closed fracture of humerus    left- going to have complete shoulder replacement   . Tenosynovitis of left wrist 2018    Past Surgical History:  Procedure  Laterality Date  . KNEE SURGERY Left 2010 and 2012    Family History  Problem Relation Age of Onset  . Hearing loss Mother   . Stroke Brother   . Arthritis Maternal Aunt   . Mental illness Maternal Aunt   . Asthma Maternal Grandmother   . Hearing loss Maternal Grandmother   . Mental illness Maternal Grandfather       Current Outpatient Medications:  .  albuterol (PROVENTIL HFA;VENTOLIN HFA) 108 (90 Base) MCG/ACT inhaler, Inhale 2 puffs into the lungs every 6 (six) hours as needed for wheezing or shortness of breath., Disp: 1 Inhaler, Rfl: 2 .  cetirizine (ZYRTEC) 10 MG tablet, Take 1 tablet (10 mg total) by mouth daily as needed for allergies., Disp: 90 tablet, Rfl: 3 .  fluticasone (FLONASE) 50 MCG/ACT nasal spray, Place 2 sprays into both nostrils daily., Disp: 16 g, Rfl: 6 .  montelukast (SINGULAIR) 10 MG tablet, Take 1 tablet (10 mg total) by mouth at bedtime., Disp: 90 tablet, Rfl: 3  EXAM:  VITALS per patient if applicable: T 97.4 Wt 209 lb 9.6 oz (95.1 kg)   BMI 29.23 kg/m    GENERAL: alert, oriented, appears well and in no acute distress  HEENT: atraumatic, conjunttiva clear, no obvious abnormalities on inspection of external nose and ears  NECK: normal movements of the head and neck  LUNGS: on inspection no signs of respiratory  distress, breathing rate appears normal, no obvious gross SOB, gasping or wheezing He is intermittently coughing 2-3 times w/out any sound of rattle or wheeze.  CV: no obvious cyanosis  MS: moves all visible extremities without noticeable abnormality  PSYCH/NEURO: pleasant and cooperative, no obvious depression or anxiety, speech and thought processing grossly intact  ASSESSMENT AND PLAN:  Discussed the following assessment and plan:  1) Dry cough, hx of allergic rhinitis and asthma. I think he may be having some cough -variant asthma sx's as well as upper airway cough syndrome. Discussed dx with him. Decided to start pantoprazole  and give another steroid taper: 40mg  qd x 3d, then 20mg  qd x 3d, then 10mg  qd x 4d. He'll continue to use albuterol 2p q4h prn, singulair, zyrtec, and flonase.  Further recommendations:  Elevate the head of your bed (ideally with 6 inch  bed blocks),  Smoking cessation, avoidance of late meals, excessive alcohol, and avoid fatty foods, chocolate, peppermint, colas, red wine, and acidic juices such as orange juice.  NO MINT OR MENTHOL PRODUCTS SO NO COUGH DROPS   USE SUGARLESS CANDY INSTEAD (Jolley ranchers or Stover's or Life Savers) or even ice chips will also do - the key is to swallow to prevent all throat clearing. NO OIL BASED VITAMINS - use powdered substitutes.   I discussed the assessment and treatment plan with the patient. The patient was provided an opportunity to ask questions and all were answered. The patient agreed with the plan and demonstrated an understanding of the instructions.   The patient was advised to call back or seek an in-person evaluation if the symptoms worsen or if the condition fails to improve as anticipated.  I provided 25 minutes of non-face-to-face time during this encounter.   Signed:  Santiago BumpersPhil McGowen, MD           01/22/2019

## 2019-01-22 NOTE — Telephone Encounter (Signed)
Pt returned the call and everything reviewed for his appt.

## 2019-01-23 DIAGNOSIS — M25512 Pain in left shoulder: Secondary | ICD-10-CM | POA: Diagnosis not present

## 2019-01-23 DIAGNOSIS — Z4789 Encounter for other orthopedic aftercare: Secondary | ICD-10-CM | POA: Diagnosis not present

## 2019-01-23 DIAGNOSIS — M19012 Primary osteoarthritis, left shoulder: Secondary | ICD-10-CM | POA: Diagnosis not present

## 2019-01-27 DIAGNOSIS — M9901 Segmental and somatic dysfunction of cervical region: Secondary | ICD-10-CM | POA: Diagnosis not present

## 2019-01-27 DIAGNOSIS — M50322 Other cervical disc degeneration at C5-C6 level: Secondary | ICD-10-CM | POA: Diagnosis not present

## 2019-01-27 DIAGNOSIS — M50321 Other cervical disc degeneration at C4-C5 level: Secondary | ICD-10-CM | POA: Diagnosis not present

## 2019-01-30 DIAGNOSIS — M19012 Primary osteoarthritis, left shoulder: Secondary | ICD-10-CM | POA: Diagnosis not present

## 2019-01-30 DIAGNOSIS — Z4789 Encounter for other orthopedic aftercare: Secondary | ICD-10-CM | POA: Diagnosis not present

## 2019-01-30 DIAGNOSIS — M25512 Pain in left shoulder: Secondary | ICD-10-CM | POA: Diagnosis not present

## 2019-02-13 ENCOUNTER — Other Ambulatory Visit: Payer: Self-pay | Admitting: Family Medicine

## 2019-02-13 NOTE — Telephone Encounter (Signed)
RF request for  Protonix 40mg    LOV: 01/22/2019 Next ov: Not scheduled  Last written: 01/22/2019  Dr Milinda Cave saw patient for chronic cough. Pt took 1 tab BID x15 days and then 1 tab qd for 15 days. Please advise if okay refill medication

## 2019-02-16 MED ORDER — PANTOPRAZOLE SODIUM 40 MG PO TBEC: 40.0000 mg | DELAYED_RELEASE_TABLET | Freq: Every day | ORAL | 3 refills | Status: AC

## 2019-02-16 NOTE — Telephone Encounter (Signed)
Refills provided 

## 2019-02-17 DIAGNOSIS — M50321 Other cervical disc degeneration at C4-C5 level: Secondary | ICD-10-CM | POA: Diagnosis not present

## 2019-02-17 DIAGNOSIS — M50322 Other cervical disc degeneration at C5-C6 level: Secondary | ICD-10-CM | POA: Diagnosis not present

## 2019-02-17 DIAGNOSIS — M9901 Segmental and somatic dysfunction of cervical region: Secondary | ICD-10-CM | POA: Diagnosis not present

## 2019-03-02 DIAGNOSIS — Z4789 Encounter for other orthopedic aftercare: Secondary | ICD-10-CM | POA: Diagnosis not present

## 2019-03-02 DIAGNOSIS — M25512 Pain in left shoulder: Secondary | ICD-10-CM | POA: Diagnosis not present

## 2019-03-02 DIAGNOSIS — M19012 Primary osteoarthritis, left shoulder: Secondary | ICD-10-CM | POA: Diagnosis not present
